# Patient Record
Sex: Female | Born: 1970 | Race: Black or African American | Hispanic: No | Marital: Single | Smoking: Current every day smoker
Health system: Southern US, Community
[De-identification: ages and names within clinical notes are randomized; demographics above are authoritative.]

## PROBLEM LIST (undated history)

## (undated) DIAGNOSIS — I10 Essential (primary) hypertension: Secondary | ICD-10-CM

## (undated) DIAGNOSIS — I2699 Other pulmonary embolism without acute cor pulmonale: Secondary | ICD-10-CM

## (undated) DIAGNOSIS — B009 Herpesviral infection, unspecified: Secondary | ICD-10-CM

## (undated) DIAGNOSIS — E119 Type 2 diabetes mellitus without complications: Secondary | ICD-10-CM

## (undated) HISTORY — DX: Essential (primary) hypertension: I10

## (undated) HISTORY — PX: PACEMAKER IMPLANT: EP1218

---

## 2004-02-10 ENCOUNTER — Emergency Department (HOSPITAL_COMMUNITY): Admission: EM | Admit: 2004-02-10 | Discharge: 2004-02-10 | Payer: Self-pay

## 2009-04-12 ENCOUNTER — Emergency Department (HOSPITAL_COMMUNITY): Admission: EM | Admit: 2009-04-12 | Discharge: 2009-04-12 | Payer: Self-pay | Admitting: Emergency Medicine

## 2009-08-02 ENCOUNTER — Encounter: Admission: RE | Admit: 2009-08-02 | Discharge: 2009-08-02 | Payer: Self-pay | Admitting: Specialist

## 2009-08-26 DIAGNOSIS — B20 Human immunodeficiency virus [HIV] disease: Secondary | ICD-10-CM | POA: Diagnosis present

## 2009-08-26 DIAGNOSIS — Z8659 Personal history of other mental and behavioral disorders: Secondary | ICD-10-CM | POA: Insufficient documentation

## 2009-08-26 DIAGNOSIS — R413 Other amnesia: Secondary | ICD-10-CM | POA: Insufficient documentation

## 2010-06-05 ENCOUNTER — Encounter: Payer: Self-pay | Admitting: Specialist

## 2010-08-17 LAB — URINALYSIS, ROUTINE W REFLEX MICROSCOPIC
Glucose, UA: NEGATIVE mg/dL
Hgb urine dipstick: NEGATIVE
Protein, ur: NEGATIVE mg/dL
pH: 7.5 (ref 5.0–8.0)

## 2010-08-17 LAB — COMPREHENSIVE METABOLIC PANEL
ALT: 11 U/L (ref 0–35)
CO2: 28 mEq/L (ref 19–32)
Chloride: 101 mEq/L (ref 96–112)
Creatinine, Ser: 0.65 mg/dL (ref 0.4–1.2)
GFR calc non Af Amer: >60 mL/min (ref >60)
Total Bilirubin: 0.7 mg/dL (ref 0.3–1.2)

## 2010-08-17 LAB — DIFFERENTIAL
Basophils Relative: 0 % (ref 0–1)
Eosinophils Absolute: 0 10*3/uL (ref 0.0–0.7)
Eosinophils Relative: 0 % (ref 0–5)
Lymphocytes Relative: 15 % (ref 12–46)
Monocytes Absolute: 0.2 10*3/uL (ref 0.1–1.0)
Neutro Abs: 7.5 10*3/uL (ref 1.7–7.7)
Neutrophils Relative %: 82 % — ABNORMAL HIGH (ref 43–77)

## 2010-08-17 LAB — CBC
Hemoglobin: 14.4 g/dL (ref 12.0–15.0)
MCHC: 33.5 g/dL (ref 30.0–36.0)
RBC: 4.89 MIL/uL (ref 3.87–5.11)
RDW: 13.8 % (ref 11.5–15.5)

## 2010-08-17 LAB — D-DIMER, QUANTITATIVE: D-Dimer, Quant: <0.22 ug/mL-FEU (ref 0.00–0.48)

## 2012-10-19 DIAGNOSIS — F339 Major depressive disorder, recurrent, unspecified: Secondary | ICD-10-CM | POA: Diagnosis present

## 2014-12-14 DIAGNOSIS — I10 Essential (primary) hypertension: Secondary | ICD-10-CM | POA: Diagnosis present

## 2015-05-07 DIAGNOSIS — Z86711 Personal history of pulmonary embolism: Secondary | ICD-10-CM | POA: Diagnosis present

## 2015-05-07 DIAGNOSIS — I82531 Chronic embolism and thrombosis of right popliteal vein: Secondary | ICD-10-CM | POA: Diagnosis present

## 2015-05-07 DIAGNOSIS — I2699 Other pulmonary embolism without acute cor pulmonale: Secondary | ICD-10-CM | POA: Diagnosis present

## 2015-06-23 DIAGNOSIS — I421 Obstructive hypertrophic cardiomyopathy: Secondary | ICD-10-CM | POA: Diagnosis present

## 2015-06-23 DIAGNOSIS — Z91148 Patient's other noncompliance with medication regimen for other reason: Secondary | ICD-10-CM | POA: Insufficient documentation

## 2015-06-23 DIAGNOSIS — I4729 Other ventricular tachycardia: Secondary | ICD-10-CM | POA: Insufficient documentation

## 2015-09-24 DIAGNOSIS — Z8669 Personal history of other diseases of the nervous system and sense organs: Secondary | ICD-10-CM | POA: Insufficient documentation

## 2018-07-08 DIAGNOSIS — E1169 Type 2 diabetes mellitus with other specified complication: Secondary | ICD-10-CM | POA: Diagnosis present

## 2018-07-08 DIAGNOSIS — R87622 Low grade squamous intraepithelial lesion on cytologic smear of vagina (LGSIL): Secondary | ICD-10-CM | POA: Insufficient documentation

## 2018-07-08 DIAGNOSIS — E119 Type 2 diabetes mellitus without complications: Secondary | ICD-10-CM

## 2018-07-08 DIAGNOSIS — G4730 Sleep apnea, unspecified: Secondary | ICD-10-CM | POA: Diagnosis present

## 2018-07-10 DIAGNOSIS — R7989 Other specified abnormal findings of blood chemistry: Secondary | ICD-10-CM | POA: Insufficient documentation

## 2018-07-18 DIAGNOSIS — E0590 Thyrotoxicosis, unspecified without thyrotoxic crisis or storm: Secondary | ICD-10-CM | POA: Insufficient documentation

## 2018-10-21 DIAGNOSIS — E041 Nontoxic single thyroid nodule: Secondary | ICD-10-CM | POA: Insufficient documentation

## 2018-10-29 DIAGNOSIS — E042 Nontoxic multinodular goiter: Secondary | ICD-10-CM | POA: Insufficient documentation

## 2018-12-30 ENCOUNTER — Encounter (INDEPENDENT_AMBULATORY_CARE_PROVIDER_SITE_OTHER): Payer: Self-pay | Admitting: Ophthalmology

## 2019-01-06 NOTE — Progress Notes (Addendum)
[facility] Note  01/07/2019     CHIEF COMPLAINT Patient presents for Retina Follow Up   HISTORY OF PRESENT ILLNESS: Lynn Bates is a 48 y.o. female who presents to the clinic today for:   HPI    Retina Follow Up    Patient presents with  Retinal Break/Detachment.  In right eye.  This started weeks ago.  Severity is moderate.  Duration of weeks.  Since onset it is gradually worsening.  I, the attending physician,  performed the HPI with the patient and updated documentation appropriately.          Comments    NP retinal eval--pigmented ret holes OD Patient states her vision is very blurry in both eyes.  She complains of occasional floaters and flashes of light OU.  Patient denies eye pain or discomfort.       Last edited by Lynn Caffey, MD on 01/07/2019 11:12 PM. (History)    pt states she saw Lynn Bates for routine eye exam and was sent here bc he thought he saw some holes in the back of her eye, pt states about 3 weeks ago she was seeing black spots in her right eye, but they are gone now, pt states she has diabetes and HTN, she states she has a pacemaker, pt has an IVC filter, pt states she is HIV+ and her CD4 count is good  Referring physician: Shirleen Schirmer, PA-C [address] [place],  [place] [address]  HISTORICAL INFORMATION:   Selected notes from the [place] Referred by Lynn Schirmer, PA-C for concern of pigmented holes OD LEE:08.05.20 (Lynn Bates) [BCVA: 20/20 OU]  Ocular Hx-glaucoma suspect, DES, NS PMH-DM, HTN, blood clots   CURRENT MEDICATIONS: No current outpatient medications on file. (Ophthalmic Drugs)   No current facility-administered medications for this visit.  (Ophthalmic Drugs)   No current outpatient medications on file. (Other)   No current facility-administered medications for this visit.  (Other)      REVIEW OF SYSTEMS: ROS    Positive for: Eyes   Negative for: Constitutional,  Gastrointestinal, Neurological, Skin, Genitourinary, Musculoskeletal, HENT, Endocrine, Cardiovascular, Respiratory, Psychiatric, Allergic/Imm, Heme/Lymph   Last edited by Lynn Bates on 01/07/2019  1:30 PM. (History)       ALLERGIES Not on File  PAST MEDICAL HISTORY History reviewed. No pertinent past medical history. History reviewed. No pertinent surgical history.  FAMILY HISTORY History reviewed. No pertinent family history.  SOCIAL HISTORY Social History   Tobacco Use  . Smoking status: Not on file  Substance Use Topics  . Alcohol use: Not on file  . Drug use: Not on file         OPHTHALMIC EXAM:  Base Eye Exam    Visual Acuity (Snellen - Linear)      Right Left   Dist [place] 20/25 20/20   Dist ph [place] 20/20        Tonometry (Tonopen, 1:40 PM)      Right Left   Pressure 14 12       Pupils      Dark Light Shape React APD   Right 4 3 Round Brisk 0   Left 4 3 Round Brisk 0       Visual Fields      Left Right    Full Full       Extraocular Movement      Right Left    Full Full  Neuro/Psych    Oriented x3: Yes   Mood/Affect: Normal       Dilation    Both eyes: 1.0% Mydriacyl, 2.5% Phenylephrine @ 1:40 PM        Slit Lamp and Fundus Exam    Slit Lamp Exam      Right Left   Lids/Lashes Normal Normal   Conjunctiva/Sclera mild Melanosis, nasal Pinguecula Melanosis, Pinguecula   Cornea 1+ Punctate epithelial erosions, Arcus 1+ Punctate epithelial erosions, Arcus   Anterior Chamber Deep and quiet Deep and quiet   Iris Round and dilated Round and dilated   Lens 1+ Nuclear sclerosis, 1-2+ Cortical cataract 1+ Nuclear sclerosis, 1-2+ Cortical cataract   Vitreous Vitreous syneresis Vitreous syneresis       Fundus Exam      Right Left   Disc Pink and Sharp Pink and Sharp   C/D Ratio 0.6 0.5   Macula Flat, Good foveal reflex, No heme or edema Flat, Good foveal reflex, No heme or edema   Vessels mild Vascular attenuation Vascular attenuation    Periphery Attached, pigmented CR scar at 0600  Attached, pigment changes inferiorly        Refraction    Wearing Rx      Sphere   Right None   Left None       Manifest Refraction      Sphere Cylinder Axis Dist VA   Right -0.50 +0.50 155 20/20   Left -0.50 +0.50 025 20/20          IMAGING AND PROCEDURES  Imaging and Procedures for _0 @  OCT, Retina - OU - Both Eyes       Right Eye Quality was good. Central Foveal Thickness: 269. Progression has no prior data. Findings include normal foveal contour, no IRF, no SRF, vitreomacular adhesion .   Left Eye Quality was good. Central Foveal Thickness: 270. Progression has no prior data. Findings include normal foveal contour, no IRF, no SRF, vitreomacular adhesion .   Notes *Images captured and stored on drive  Diagnosis / Impression:  NFP, no IRF/SRF/VMA OU  Clinical management:  See below  Abbreviations: NFP - Normal foveal profile. CME - cystoid macular edema. PED - pigment epithelial detachment. IRF - intraretinal fluid. SRF - subretinal fluid. EZ - ellipsoid zone. ERM - epiretinal membrane. ORA - outer retinal atrophy. ORT - outer retinal tubulation. SRHM - subretinal hyper-reflective material                 ASSESSMENT/PLAN:    ICD-10-CM   1. 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 Retinal edema  H35.81 OCT, Retina - OU - Both Eyes  3. Diabetes mellitus type 2 without retinopathy ([place])  E11.9   4. Essential hypertension  I10   5. Hypertensive retinopathy of both eyes  H35.033   6. Combined forms of age-related cataract of both eyes  H25.813   7. 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    - Potential treatment options including observation  and laser retinopexy discussed with patient.  - recommend observation at this time  - f/u in 3-4 wks, sooner prn  2. No retinal edema on exam or OCT  3. Diabetes mellitus,  type 2 without retinopathy  - The incidence, risk factors for progression, natural history and treatment options for diabetic retinopathy  were discussed with patient.    - The need for close monitoring of blood glucose, blood pressure, and serum lipids, avoiding cigarette or any type of tobacco, and the need for long term follow up was also discussed with patient.  - f/u in 1 year, sooner prn   4,5. Hypertensive retinopathy OU  - discussed importance of tight BP control  - monitor  6. Mixed form age related cataracts OU  - mild  - The symptoms of cataract, surgical options, and treatments and risks were discussed with patient.  - discussed diagnosis and progression  - not yet visually significant  - monitor for now  7. 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      Return 3-4 wks, for Dilated Exam, OCT.  There are no Patient Instructions on file for this visit.   Explained the diagnoses, plan, and follow up with the patient and they expressed understanding.  Patient expressed understanding of the importance of proper follow up care.   This document serves as a record of services personally performed by Gardiner Sleeper, MD, PhD. It was created on their behalf by Ernest Mallick, OA, an ophthalmic assistant. The creation of this record is the provider's dictation and/or activities during the visit.    Electronically signed by: Ernest Mallick, OA 08.24.2020 11:27 PM    Gardiner Sleeper, M.D., Ph.D. Diseases & Surgery of the Retina and Vitreous [facility]  I have reviewed the above documentation for accuracy and completeness, and I agree with the above. Gardiner Sleeper,  M.D., Ph.D. 01/07/19 11:27 PM    Abbreviations: M myopia (nearsighted); A astigmatism; H hyperopia (farsighted); P presbyopia; Mrx spectacle prescription;  CTL contact lenses; OD right eye; OS left eye; OU both eyes  XT exotropia; ET esotropia; PEK punctate epithelial keratitis; PEE punctate epithelial erosions; DES dry eye syndrome; MGD meibomian gland dysfunction; ATs artificial tears; PFAT's preservative free artificial tears; [place] nuclear sclerotic cataract; PSC posterior subcapsular cataract; ERM epi-retinal membrane; PVD posterior vitreous detachment; RD retinal detachment; DM diabetes mellitus; DR diabetic retinopathy; NPDR non-proliferative diabetic retinopathy; PDR proliferative diabetic retinopathy; CSME clinically significant macular edema; DME diabetic macular edema; dbh dot blot hemorrhages; CWS cotton wool spot; POAG primary open angle glaucoma; C/D cup-to-disc ratio; HVF humphrey visual field; GVF goldmann visual field; OCT optical coherence tomography; IOP intraocular pressure; BRVO Branch retinal vein occlusion; CRVO central retinal vein occlusion; CRAO central retinal artery occlusion; BRAO branch retinal artery occlusion; RT retinal tear; SB scleral buckle; PPV pars plana vitrectomy; VH Vitreous hemorrhage; PRP panretinal laser photocoagulation; IVK intravitreal kenalog; VMT vitreomacular traction; MH Macular hole;  NVD neovascularization of the disc; NVE neovascularization elsewhere; AREDS age related eye disease study; ARMD age related macular degeneration; POAG primary open angle glaucoma; EBMD epithelial/anterior basement membrane dystrophy; ACIOL anterior chamber intraocular lens; IOL intraocular lens; PCIOL posterior chamber intraocular lens; Phaco/IOL phacoemulsification with intraocular lens placement; [place] photorefractive keratectomy; LASIK laser assisted in situ keratomileusis; HTN hypertension; DM diabetes mellitus; COPD chronic obstructive pulmonary disease

## 2019-01-07 ENCOUNTER — Ambulatory Visit (INDEPENDENT_AMBULATORY_CARE_PROVIDER_SITE_OTHER): Payer: Medicaid Other | Admitting: Ophthalmology

## 2019-01-07 ENCOUNTER — Other Ambulatory Visit: Payer: Self-pay

## 2019-01-07 ENCOUNTER — Encounter (INDEPENDENT_AMBULATORY_CARE_PROVIDER_SITE_OTHER): Payer: Self-pay | Admitting: Ophthalmology

## 2019-01-07 DIAGNOSIS — H33301 Unspecified retinal break, right eye: Secondary | ICD-10-CM | POA: Diagnosis not present

## 2019-01-07 DIAGNOSIS — H3581 Retinal edema: Secondary | ICD-10-CM | POA: Diagnosis not present

## 2019-01-07 DIAGNOSIS — B20 Human immunodeficiency virus [HIV] disease: Secondary | ICD-10-CM

## 2019-01-07 DIAGNOSIS — I10 Essential (primary) hypertension: Secondary | ICD-10-CM

## 2019-01-07 DIAGNOSIS — E119 Type 2 diabetes mellitus without complications: Secondary | ICD-10-CM | POA: Diagnosis not present

## 2019-01-07 DIAGNOSIS — H25813 Combined forms of age-related cataract, bilateral: Secondary | ICD-10-CM

## 2019-01-07 DIAGNOSIS — H35033 Hypertensive retinopathy, bilateral: Secondary | ICD-10-CM

## 2019-01-28 ENCOUNTER — Encounter (INDEPENDENT_AMBULATORY_CARE_PROVIDER_SITE_OTHER): Payer: Medicaid Other | Admitting: Ophthalmology

## 2019-01-30 NOTE — Progress Notes (Addendum)
[facility] - Clinic Note  01/31/2019     CHIEF COMPLAINT Patient presents for Retina Follow Up   HISTORY OF PRESENT ILLNESS: Lynn Bates is a 48 y.o. female who presents to the clinic today for:   HPI    Retina Follow Up    Patient presents with  Other.  In both eyes.  This started weeks ago.  Severity is moderate.  Duration of weeks.  Since onset it is stable.  I, the attending physician,  performed the HPI with the patient and updated documentation appropriately.          Comments    Patient states her vision is about the same.  She denies eye pain or discomfort and denies any new or worsening floaters or fol OU.       Last edited by [name], MD on 01/31/2019 10:30 AM. (History)    pt states   Referring physician: [name], PA-C [address] [place],  [place] [address]  HISTORICAL INFORMATION:   Selected notes from the [facility] Referred by [name], PA-C for concern of pigmented holes OD LEE:08.05.20 (A. Lundquist) [BCVA: 20/20 OU]  Ocular Hx-glaucoma suspect, DES, NS PMH-DM, HTN, blood clots   CURRENT MEDICATIONS: Current Outpatient Medications (Ophthalmic Drugs)  Medication Sig  . prednisoLONE acetate (PRED FORTE) 1 % ophthalmic suspension Place 1 drop into the right eye 4 (four) times daily for 7 days.   No current facility-administered medications for this visit.  (Ophthalmic Drugs)   No current outpatient medications on file. (Other)   No current facility-administered medications for this visit.  (Other)      REVIEW OF SYSTEMS: ROS    Positive for: Eyes   Negative for: Constitutional, Gastrointestinal, Neurological, Skin, Genitourinary, Musculoskeletal, HENT, Endocrine, Cardiovascular, Respiratory, Psychiatric, Allergic/Imm, Heme/Lymph   Last edited by [name] on 01/31/2019  9:47 AM. (History)       ALLERGIES Not on File  PAST MEDICAL HISTORY History reviewed. No pertinent past  medical history. History reviewed. No pertinent surgical history.  FAMILY HISTORY History reviewed. No pertinent family history.  SOCIAL HISTORY Social History   Tobacco Use  . Smoking status: Not on file  Substance Use Topics  . Alcohol use: Not on file  . Drug use: Not on file         OPHTHALMIC EXAM:  Base Eye Exam    Visual Acuity (Snellen - Linear)      Right Left   Dist [place] 20/20 20/20       Tonometry (Tonopen, 9:51 AM)      Right Left   Pressure 22 23       Tonometry #2 (Tonopen, 10:42 AM)      Right Left   Pressure 19 17       Pupils      Dark Light Shape React APD   Right 3 2 Round Brisk 0   Left 3 2 Round Brisk 0       Visual Fields      Left Right    Full Full       Extraocular Movement      Right Left    Full Full       Neuro/Psych    Oriented x3: Yes   Mood/Affect: Normal       Dilation    Both eyes: 1.0% Mydriacyl, 2.5% Phenylephrine @ 9:52 AM        Slit Lamp and Fundus  Exam    Slit Lamp Exam      Right Left   Lids/Lashes Normal Normal   Conjunctiva/Sclera mild Melanosis, nasal Pinguecula Melanosis, Pinguecula   Cornea 1+ Punctate epithelial erosions, Arcus 1+ Punctate epithelial erosions, Arcus   Anterior Chamber Deep and quiet Deep and quiet   Iris Round and dilated Round and dilated   Lens 1+ Nuclear sclerosis, 1-2+ Cortical cataract 1+ Nuclear sclerosis, 1-2+ Cortical cataract   Vitreous Vitreous syneresis Vitreous syneresis       Fundus Exam      Right Left   Disc Pink and Sharp Pink and Sharp   C/D Ratio 0.6 0.5   Macula Flat, Good foveal reflex, No heme or edema Flat, Good foveal reflex, No heme or edema   Vessels mild Vascular attenuation Vascular attenuation   Periphery Attached, pigmented retinal defect/CR scar at 0600  Attached, pigment changes inferiorly        Refraction    Wearing Rx      Sphere   Right None   Left None          IMAGING AND PROCEDURES  Imaging and Procedures for @TODAY @  OCT,  Retina - OU - Both Eyes       Right Eye Quality was good. Central Foveal Thickness: 268. Progression has been stable. Findings include normal foveal contour, no IRF, no SRF, vitreomacular adhesion .   Left Eye Quality was good. Central Foveal Thickness: 268. Progression has been stable. Findings include normal foveal contour, no IRF, no SRF, vitreomacular adhesion .   Notes *Images captured and stored on drive  Diagnosis / Impression:  NFP, no IRF/SRF; +VMA OU  Clinical management:  See below  Abbreviations: NFP - Normal foveal profile. CME - cystoid macular edema. PED - pigment epithelial detachment. IRF - intraretinal fluid. SRF - subretinal fluid. EZ - ellipsoid zone. ERM - epiretinal membrane. ORA - outer retinal atrophy. ORT - outer retinal tubulation. SRHM - subretinal hyper-reflective material        Repair Retinal Breaks, Laser - OD - Right Eye       LASER PROCEDURE NOTE  Procedure:  Barrier laser retinopexy using slit lamp laser, RIGHT eye   Diagnosis:   Retinal defect, RIGHT eye                     6 o'clock equator   Surgeon: [name], MD, PhD  Anesthesia: Topical  Informed consent obtained, operative eye marked, and time out performed prior to initiation of laser.   Laser settings:  Lumenis [id number] laser, slit lamp Lens: [place] PRP 165 Power: 250 mW Spot size: 200 microns Duration: 30 msec  # spots: 175  Placement of laser: Using a [place] PRP 165 contact lens at the slit lamp, laser was placed in three confluent rows around retinal defect at 6 oclock equator with additional rows anteriorly.  Complications: None.  Patient tolerated the procedure well and received written and verbal post-procedure care information/education.                  ASSESSMENT/PLAN:    ICD-10-CM   1. Right retinal defect  H33.301 Repair Retinal Breaks, Laser - OD - Right Eye  2. Retinal edema  H35.81 OCT, Retina - OU - Both Eyes  3. Diabetes mellitus  type 2 without retinopathy ([place])  E11.9   4. Essential hypertension  I10   5. Hypertensive retinopathy of both eyes  H35.033   6. Combined forms of age-related  cataract of both eyes  H25.813   7. HIV disease ([facility])  B20     1. Retinal defect OD  - focal pigmented retinal defect/CR scar located at 0600, equator  - asymptomatic  - no surrounding SRF  - The incidence, risk factors, and natural history of retinal defect was discussed with patient.    - Potential treatment options including observation and laser retinopexy discussed with patient.  - recommend laser retinopexy OD today, 09.18.20  - pt wishes to proceed  - RBA of procedure discussed, questions answered  - informed consent obtained and signed  - see procedure note  - start PF QID OD x7d  - f/u in 2-3 wks  2. No retinal edema on exam or OCT  3. Diabetes mellitus, type 2 without retinopathy  - The incidence, risk factors for progression, natural history and treatment options for diabetic retinopathy  were discussed with patient.    - The need for close monitoring of blood glucose, blood pressure, and serum lipids, avoiding cigarette or any type of tobacco, and the need for long term follow up was also discussed with patient.  - f/u in 1 year, sooner prn   4,5. Hypertensive retinopathy OU  - discussed importance of tight BP control  - monitor  6. Mixed form age related cataracts OU  - mild  - The symptoms of cataract, surgical options, and treatments and risks were discussed with patient.  - discussed diagnosis and progression  - not yet visually significant  - monitor for now  7. 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 prednisoLONE acetate (PRED FORTE) 1 % ophthalmic suspension    Sig: Place 1 drop into the right eye 4 (four) times daily for 7 days.    Dispense:  10 mL    Refill:  0       Return in about 2  weeks (around 02/14/2019) for f/u retinal defect OD, DFE, OCT.  There are no Patient Instructions on file for this visit.   Explained the diagnoses, plan, and follow up with the patient and they expressed understanding.  Patient expressed understanding of the importance of proper follow up care.   This document serves as a record of services personally performed by Karie ChimeraBrian G. Jamariyah Johannsen, MD, PhD. It was created on their behalf by Laurian BrimAmanda Brown, OA, an ophthalmic assistant. The creation of this record is the provider's dictation and/or activities during the visit.    Electronically signed by: Laurian BrimAmanda Brown, OA  09.17.2020 12:14 PM    Karie ChimeraBrian G. Chin Wachter, M.D., Ph.D. Diseases & Surgery of the Retina and Vitreous [facility]  I have reviewed the above documentation for accuracy and completeness, and I agree with the above. Karie ChimeraBrian G. Amilyah Nack, M.D., Ph.D. 01/31/19 12:14 PM   Abbreviations: M myopia (nearsighted); A astigmatism; H hyperopia (farsighted); P presbyopia; Mrx spectacle prescription;  CTL contact lenses; OD right eye; OS left eye; OU both eyes  XT exotropia; ET esotropia; PEK punctate epithelial keratitis; PEE punctate epithelial erosions; DES dry eye syndrome; MGD meibomian gland dysfunction; ATs artificial tears; PFAT's preservative free artificial tears; NSC nuclear sclerotic cataract; PSC posterior subcapsular cataract; ERM epi-retinal membrane; PVD posterior vitreous detachment; RD retinal detachment; DM diabetes mellitus; DR diabetic retinopathy; NPDR non-proliferative diabetic retinopathy; PDR proliferative diabetic retinopathy; CSME clinically significant macular edema; DME diabetic macular edema; dbh dot blot hemorrhages; CWS cotton  wool spot; POAG primary open angle glaucoma; C/D cup-to-disc ratio; HVF humphrey visual field; GVF goldmann visual field; OCT optical coherence tomography; IOP intraocular pressure; BRVO Branch retinal vein occlusion; CRVO central retinal vein  occlusion; CRAO central retinal artery occlusion; BRAO branch retinal artery occlusion; RT retinal tear; SB scleral buckle; PPV pars plana vitrectomy; VH Vitreous hemorrhage; PRP panretinal laser photocoagulation; IVK intravitreal kenalog; VMT vitreomacular traction; MH Macular hole;  NVD neovascularization of the disc; NVE neovascularization elsewhere; AREDS age related eye disease study; ARMD age related macular degeneration; POAG primary open angle glaucoma; EBMD epithelial/anterior basement membrane dystrophy; ACIOL anterior chamber intraocular lens; IOL intraocular lens; PCIOL posterior chamber intraocular lens; Phaco/IOL phacoemulsification with intraocular lens placement; PRK photorefractive keratectomy; LASIK laser assisted in situ keratomileusis; HTN hypertension; DM diabetes mellitus; COPD chronic obstructive pulmonary disease

## 2019-01-31 ENCOUNTER — Ambulatory Visit (INDEPENDENT_AMBULATORY_CARE_PROVIDER_SITE_OTHER): Payer: Medicaid Other | Admitting: Ophthalmology

## 2019-01-31 ENCOUNTER — Other Ambulatory Visit: Payer: Self-pay

## 2019-01-31 ENCOUNTER — Encounter (INDEPENDENT_AMBULATORY_CARE_PROVIDER_SITE_OTHER): Payer: Self-pay | Admitting: Ophthalmology

## 2019-01-31 DIAGNOSIS — H3581 Retinal edema: Secondary | ICD-10-CM | POA: Diagnosis not present

## 2019-01-31 DIAGNOSIS — I10 Essential (primary) hypertension: Secondary | ICD-10-CM | POA: Diagnosis not present

## 2019-01-31 DIAGNOSIS — H33301 Unspecified retinal break, right eye: Secondary | ICD-10-CM | POA: Diagnosis not present

## 2019-01-31 DIAGNOSIS — H25813 Combined forms of age-related cataract, bilateral: Secondary | ICD-10-CM

## 2019-01-31 DIAGNOSIS — E119 Type 2 diabetes mellitus without complications: Secondary | ICD-10-CM

## 2019-01-31 DIAGNOSIS — H35033 Hypertensive retinopathy, bilateral: Secondary | ICD-10-CM

## 2019-01-31 DIAGNOSIS — B20 Human immunodeficiency virus [HIV] disease: Secondary | ICD-10-CM

## 2019-01-31 MED ORDER — PREDNISOLONE ACETATE 1 % OP SUSP: 1.0000 [drp] | Freq: Four times a day (QID) | OPHTHALMIC | 0 refills | Status: AC

## 2019-02-12 ENCOUNTER — Encounter (INDEPENDENT_AMBULATORY_CARE_PROVIDER_SITE_OTHER): Payer: Medicaid Other | Admitting: Ophthalmology

## 2019-02-17 DIAGNOSIS — K219 Gastro-esophageal reflux disease without esophagitis: Secondary | ICD-10-CM | POA: Diagnosis present

## 2019-02-17 NOTE — Progress Notes (Signed)
[facility] - Clinic Note  02/25/2019     CHIEF COMPLAINT Patient presents for Retina Follow Up   HISTORY OF PRESENT ILLNESS: Lynn Bates is a 48 y.o. female who presents to the clinic today for:   HPI    Retina Follow Up    Patient presents with  Other.  In right eye.  This started weeks ago.  Severity is moderate.  Duration of weeks.  Since onset it is stable.  I, the attending physician,  performed the HPI with the patient and updated documentation appropriately.          Comments    Pt states her vision is still very good.  Patient complains of pain above brow OD.  Patient denies any new or worsening floaters or fol OU.       Last edited by Rennis ChrisZamora, Spenser Harren, MD on 02/25/2019  1:01 PM. (History)    pt states she is delayed to follow up due to having another appt, she states her right eye brow has been hurting for a couple days  Referring physician: Alma DownsAlbert Lundquist, PA-C [address] [place],  [place] [id number]  HISTORICAL INFORMATION:   Selected notes from the MEDICAL RECORD NUMBER Referred by Alma DownsAlbert Lundquist, PA-C for concern of pigmented holes OD LEE:08.05.20 (A. Lundquist) [BCVA: 20/20 OU]  Ocular Hx-glaucoma suspect, DES, NS PMH-DM, HTN, blood clots   CURRENT MEDICATIONS: No current outpatient medications on file. (Ophthalmic Drugs)   No current facility-administered medications for this visit.  (Ophthalmic Drugs)   No current outpatient medications on file. (Other)   No current facility-administered medications for this visit.  (Other)      REVIEW OF SYSTEMS: ROS    Positive for: Eyes   Negative for: Constitutional, Gastrointestinal, Neurological, Skin, Genitourinary, Musculoskeletal, HENT, Endocrine, Cardiovascular, Respiratory, Psychiatric, Allergic/Imm, Heme/Lymph   Last edited by Corrinne EagleEnglish, Ashley L on 02/25/2019 10:17 AM. (History)       ALLERGIES Not on File  PAST MEDICAL HISTORY History reviewed. No pertinent past  medical history. History reviewed. No pertinent surgical history.  FAMILY HISTORY History reviewed. No pertinent family history.  SOCIAL HISTORY Social History   Tobacco Use  . Smoking status: Not on file  Substance Use Topics  . Alcohol use: Not on file  . Drug use: Not on file         OPHTHALMIC EXAM:  Base Eye Exam    Visual Acuity (Snellen - Linear)      Right Left   Dist [place] 20/20 -1 20/20       Tonometry (Tonopen, 10:20 AM)      Right Left   Pressure 23 19       Pupils      Dark Light Shape React APD   Right 4 3 Round Brisk 0   Left 4 3 Round Brisk 0       Visual Fields      Left Right    Full Full       Extraocular Movement      Right Left    Full Full       Neuro/Psych    Oriented x3: Yes   Mood/Affect: Normal       Dilation    Both eyes: 1.0% Mydriacyl, 2.5% Phenylephrine @ 10:20 AM        Slit Lamp and Fundus Exam    Slit Lamp Exam      Right Left   Lids/Lashes Normal Normal  Conjunctiva/Sclera mild Melanosis, nasal Pinguecula, Trace Injection Melanosis, nasal and temporal Pinguecula, Trace Injection   Cornea Trace Punctate epithelial erosions, Arcus 1+ Punctate epithelial erosions, Arcus   Anterior Chamber Deep and quiet Deep and quiet   Iris Round and dilated Round and dilated   Lens 1+ Nuclear sclerosis, 1-2+ Cortical cataract 1+ Nuclear sclerosis, 1-2+ Cortical cataract   Vitreous Vitreous syneresis Vitreous syneresis       Fundus Exam      Right Left   Disc Pink and Sharp Pink and Sharp   C/D Ratio 0.6 0.5   Macula Flat, Good foveal reflex, No heme or edema Flat, Good foveal reflex, No heme or edema   Vessels mild Vascular attenuation Vascular attenuation   Periphery Attached, pigmented retinal defects/CR scar at 0600 equator -- good laser changes surrounding Attached, pigment changes inferiorly        Refraction    Wearing Rx      Sphere   Right None   Left None          IMAGING AND PROCEDURES  Imaging and  Procedures for @TODAY @  OCT, Retina - OU - Both Eyes       Right Eye Quality was good. Central Foveal Thickness: 270. Progression has been stable. Findings include normal foveal contour, no IRF, no SRF, vitreomacular adhesion .   Left Eye Quality was good. Central Foveal Thickness: 269. Progression has been stable. Findings include normal foveal contour, no IRF, no SRF, vitreomacular adhesion .   Notes *Images captured and stored on drive  Diagnosis / Impression:  NFP, no IRF/SRF; +VMA OU  Clinical management:  See below  Abbreviations: NFP - Normal foveal profile. CME - cystoid macular edema. PED - pigment epithelial detachment. IRF - intraretinal fluid. SRF - subretinal fluid. EZ - ellipsoid zone. ERM - epiretinal membrane. ORA - outer retinal atrophy. ORT - outer retinal tubulation. SRHM - subretinal hyper-reflective material                 ASSESSMENT/PLAN:    ICD-10-CM   1. 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 Retinal edema  H35.81 OCT, Retina - OU - Both Eyes  3. Diabetes mellitus type 2 without retinopathy (HCC)  E11.9   4. Essential hypertension  I10   5. Hypertensive retinopathy of both eyes  H35.033   6. Combined forms of age-related cataract of both eyes  H25.813   7. HIV disease (HCC)  B20     1. Retinal defect OD  - focal pigmented retinal defects/CR scars located at 0600, equator  - asymptomatic  - no surrounding SRF   - s/p laser retinopexy OD (09.18.20) -- good laser changes surrounding lesions  - f/u in 3 months  2. No retinal edema on exam or OCT  3. Diabetes mellitus, type 2 without retinopathy  - The incidence, risk factors for progression, natural history and treatment options for diabetic retinopathy  were discussed with patient.    - The need for close monitoring of blood glucose, blood pressure, and serum lipids, avoiding cigarette or any type of tobacco, and the need for long term follow up was also discussed with patient.  - f/u in 1  year, sooner prn   4,5. Hypertensive retinopathy OU  - discussed importance of tight BP control  - monitor  6. Mixed form age related cataracts OU  - mild  - The symptoms of cataract, surgical options, and treatments and risks were discussed with patient.  - discussed  diagnosis and progression  - not yet visually significant  - monitor for now  7. 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      Return in about 3 months (around 05/28/2019) for f/u retinal defect OD, DFE, OCT.  There are no Patient Instructions on file for this visit.   Explained the diagnoses, plan, and follow up with the patient and they expressed understanding.  Patient expressed understanding of the importance of proper follow up care.   Electronically signed by: Estill Bakes, COT 02/17/19 10:21 a.m.  Gardiner Sleeper, M.D., Ph.D. Diseases & Surgery of the Retina and Vitreous [facility] 02/25/19  I have reviewed the above documentation for accuracy and completeness, and I agree with the above. Gardiner Sleeper, M.D., Ph.D. 02/25/19 1:03 PM     Abbreviations: M myopia (nearsighted); A astigmatism; H hyperopia (farsighted); P presbyopia; Mrx spectacle prescription;  CTL contact lenses; OD right eye; OS left eye; OU both eyes  XT exotropia; ET esotropia; PEK punctate epithelial keratitis; PEE punctate epithelial erosions; DES dry eye syndrome; MGD meibomian gland dysfunction; ATs artificial tears; PFAT's preservative free artificial tears; [place] nuclear sclerotic cataract; PSC posterior subcapsular cataract; ERM epi-retinal membrane; PVD posterior vitreous detachment; RD retinal detachment; DM diabetes mellitus; DR diabetic retinopathy; NPDR non-proliferative diabetic retinopathy; PDR proliferative diabetic retinopathy; CSME clinically significant macular edema; DME diabetic  macular edema; dbh dot blot hemorrhages; CWS cotton wool spot; POAG primary open angle glaucoma; C/D cup-to-disc ratio; HVF humphrey visual field; GVF goldmann visual field; OCT optical coherence tomography; IOP intraocular pressure; BRVO Branch retinal vein occlusion; CRVO central retinal vein occlusion; CRAO central retinal artery occlusion; BRAO branch retinal artery occlusion; RT retinal tear; SB scleral buckle; PPV pars plana vitrectomy; VH Vitreous hemorrhage; PRP panretinal laser photocoagulation; IVK intravitreal kenalog; VMT vitreomacular traction; MH Macular hole;  NVD neovascularization of the disc; NVE neovascularization elsewhere; AREDS age related eye disease study; ARMD age related macular degeneration; POAG primary open angle glaucoma; EBMD epithelial/anterior basement membrane dystrophy; ACIOL anterior chamber intraocular lens; IOL intraocular lens; PCIOL posterior chamber intraocular lens; Phaco/IOL phacoemulsification with intraocular lens placement; [place] photorefractive keratectomy; LASIK laser assisted in situ keratomileusis; HTN hypertension; DM diabetes mellitus; COPD chronic obstructive pulmonary disease

## 2019-02-25 ENCOUNTER — Ambulatory Visit (INDEPENDENT_AMBULATORY_CARE_PROVIDER_SITE_OTHER): Payer: Medicaid Other | Admitting: Ophthalmology

## 2019-02-25 ENCOUNTER — Encounter (INDEPENDENT_AMBULATORY_CARE_PROVIDER_SITE_OTHER): Payer: Self-pay | Admitting: Ophthalmology

## 2019-02-25 DIAGNOSIS — H33301 Unspecified retinal break, right eye: Secondary | ICD-10-CM

## 2019-02-25 DIAGNOSIS — I10 Essential (primary) hypertension: Secondary | ICD-10-CM

## 2019-02-25 DIAGNOSIS — B20 Human immunodeficiency virus [HIV] disease: Secondary | ICD-10-CM

## 2019-02-25 DIAGNOSIS — H3581 Retinal edema: Secondary | ICD-10-CM

## 2019-02-25 DIAGNOSIS — H35033 Hypertensive retinopathy, bilateral: Secondary | ICD-10-CM

## 2019-02-25 DIAGNOSIS — E119 Type 2 diabetes mellitus without complications: Secondary | ICD-10-CM

## 2019-02-25 DIAGNOSIS — H25813 Combined forms of age-related cataract, bilateral: Secondary | ICD-10-CM

## 2019-05-28 ENCOUNTER — Encounter (INDEPENDENT_AMBULATORY_CARE_PROVIDER_SITE_OTHER): Payer: Medicaid Other | Admitting: Ophthalmology

## 2019-06-23 ENCOUNTER — Encounter (INDEPENDENT_AMBULATORY_CARE_PROVIDER_SITE_OTHER): Payer: Medicaid Other | Admitting: Ophthalmology

## 2019-07-31 NOTE — Progress Notes (Signed)
[facility] - Clinic Note  08/06/2019     CHIEF COMPLAINT Patient presents for Retina Follow Up   HISTORY OF PRESENT ILLNESS: Lynn Bates is a 49 y.o. female who presents to the clinic today for:   HPI    Retina Follow Up    Patient presents with  Other (Retinal defect).  In right eye.  Severity is moderate.  Duration of 4 months.  Since onset it is gradually worsening.  I, the attending physician,  performed the HPI with the patient and updated documentation appropriately.          Comments    Patient states vision may be slightly worse OD. On latanoprost qhs OU, but hasn't used gtt in a couple of days. Doesn't check BS at home. On prednisone (?for breathing). Last a1c unknown.        Last edited by [name], MD on 08/06/2019 11:25 AM. (History)    pt states she is still using latanoprost at night, but has not used it in a couple days  Referring physician: [name], PA-C [address] ,  [place] [address]  HISTORICAL INFORMATION:   Selected notes from the MEDICAL RECORD NUMBER Referred by [name], PA-C for concern of pigmented holes OD LEE:08.05.20 (A. Lundquist) [BCVA: 20/20 OU]  Ocular Hx-glaucoma suspect, DES, NS PMH-DM, HTN, blood clots   CURRENT MEDICATIONS: Current Outpatient Medications (Ophthalmic Drugs)  Medication Sig  . latanoprost (XALATAN) 0.005 % ophthalmic solution Place 1 drop into both eyes at bedtime.   No current facility-administered medications for this visit. (Ophthalmic Drugs)   Current Outpatient Medications (Other)  Medication Sig  . albuterol (PROAIR HFA) 108 (90 Base) MCG/ACT inhaler Inhale 1 puff into the lungs as directed.  Marland Kitchen amLODipine (NORVASC) 10 MG tablet Take 10 mg by mouth at bedtime.  Marland Kitchen apixaban (ELIQUIS) 5 MG TABS tablet Take 5 mg by mouth in the morning and at bedtime.  . budesonide-formoterol (SYMBICORT) 160-4.5 MCG/ACT inhaler Inhale 1 puff into the lungs in the morning and at  bedtime.  Marland Kitchen emtricitabine-tenofovir (TRUVADA) 200-300 MG tablet Take by mouth as directed.  . famotidine (PEPCID) 20 MG tablet Take 20 mg by mouth in the morning and at bedtime.  . furosemide (LASIX) 20 MG tablet Take 20 mg by mouth in the morning and at bedtime.  Marland Kitchen linaclotide (LINZESS) 145 MCG CAPS capsule Take 145 mcg by mouth daily.  . methocarbamol (ROBAXIN) 750 MG tablet Take 750 mg by mouth in the morning, at noon, and at bedtime.  . metoprolol tartrate (LOPRESSOR) 50 MG tablet Take 50 mg by mouth in the morning and at bedtime.  . montelukast (SINGULAIR) 10 MG tablet Take 10 mg by mouth as directed.  . pantoprazole (PROTONIX) 40 MG tablet Take 40 mg by mouth daily.  . pravastatin (PRAVACHOL) 20 MG tablet Take 20 mg by mouth daily.  . predniSONE (DELTASONE) 10 MG tablet Take 10 mg by mouth as directed.  . raltegravir (ISENTRESS) 400 MG tablet Take 400 mg by mouth in the morning and at bedtime.  . ranitidine (ZANTAC) 300 MG tablet Take 300 mg by mouth as directed.  . risperiDONE (RISPERDAL) 2 MG tablet Take 2 mg by mouth daily.  . rosuvastatin (CRESTOR) 10 MG tablet Take 10 mg by mouth at bedtime.  . Semaglutide,0.25 or 0.5MG /DOS, (OZEMPIC, 0.25 OR 0.5 MG/DOSE,) 2 MG/1.5ML SOPN Inject 0.25 mg into the skin as directed.  . Tiotropium Bromide Monohydrate 2.5 MCG/ACT AERS  Inhale 2 puffs into the lungs daily.  Marland Kitchen tiZANidine (ZANAFLEX) 2 MG tablet Take 2 mg by mouth as directed.  . topiramate (TOPAMAX) 25 MG capsule Take 25 mg by mouth in the morning and at bedtime.  . traZODone (DESYREL) 100 MG tablet Take 100 mg by mouth at bedtime.  . triamcinolone cream (KENALOG) 0.1 % Apply 1 application topically in the morning and at bedtime.   No current facility-administered medications for this visit. (Other)      REVIEW OF SYSTEMS: ROS    Positive for: Eyes   Negative for: Constitutional, Gastrointestinal, Neurological, Skin, Genitourinary, Musculoskeletal, HENT, Endocrine, Cardiovascular,  Respiratory, Psychiatric, Allergic/Imm, Heme/Lymph   Last edited by Roselee Nova D, COT on 08/06/2019 10:03 AM. (History)       ALLERGIES No Known Allergies  PAST MEDICAL HISTORY History reviewed. No pertinent past medical history. History reviewed. No pertinent surgical history.  FAMILY HISTORY Family History  Problem Relation Age of Onset  . Glaucoma Father   . Glaucoma Sister   . Diabetes Other   . Diabetes Maternal Aunt     SOCIAL HISTORY Social History   Tobacco Use  . Smoking status: Current Every Day Smoker  . Smokeless tobacco: Current User  Substance Use Topics  . Alcohol use: Not on file  . Drug use: Never         OPHTHALMIC EXAM:  Base Eye Exam    Visual Acuity (Snellen - Linear)      Right Left   Dist [place] 20/20 -1 20/20       Tonometry (Tonopen, 10:19 AM)      Right Left   Pressure 26 25  Hasn't used latanoprost in 2 days       Pupils      Dark Light Shape React APD   Right 4 3 Round Brisk None   Left 4 3 Round Brisk None       Visual Fields (Counting fingers)      Left Right    Full Full       Extraocular Movement      Right Left    Full, Ortho Full, Ortho       Neuro/Psych    Oriented x3: Yes   Mood/Affect: Normal       Dilation    Both eyes: 1.0% Mydriacyl, 2.5% Phenylephrine @ 10:19 AM        Slit Lamp and Fundus Exam    Slit Lamp Exam      Right Left   Lids/Lashes Dermatochalasis - upper lid Dermatochalasis - upper lid   Conjunctiva/Sclera mild Melanosis, nasal Pinguecula, Trace Injection Melanosis, nasal and temporal Pinguecula, Trace Injection   Cornea Trace Punctate epithelial erosions, Arcus 1+ Punctate epithelial erosions, Arcus   Anterior Chamber Deep and quiet Deep and quiet   Iris Round and dilated Round and dilated   Lens 1+ Nuclear sclerosis, 1-2+ Cortical cataract 1+ Nuclear sclerosis, 1-2+ Cortical cataract   Vitreous Vitreous syneresis Vitreous syneresis       Fundus Exam      Right Left   Disc  Pink and Sharp Pink and Sharp   C/D Ratio 0.6 0.5   Macula Flat, Good foveal reflex, No heme or edema Flat, Good foveal reflex, No heme or edema   Vessels mild Vascular attenuation, Tortuousity mild Vascular attenuation, Tortuousity   Periphery Attached, pigmented retinal defects/CR scar at 0600 equator -- good laser changes surrounding, No heme, no new RT/RD Attached, pigment changes inferiorly  Refraction    Manifest Refraction      Sphere Cylinder Axis Dist [place]   Right -0.25 +1.00 155 20/20   Left [place] +0.50 030 20/20          IMAGING AND PROCEDURES  Imaging and Procedures for @TODAY @  OCT, Retina - OU - Both Eyes       Right Eye Quality was good. Central Foveal Thickness: 274. Progression has been stable. Findings include normal foveal contour, no IRF, no SRF, vitreomacular adhesion .   Left Eye Quality was good. Central Foveal Thickness: 269. Progression has been stable. Findings include normal foveal contour, no IRF, no SRF, vitreomacular adhesion .   Notes *Images captured and stored on drive  Diagnosis / Impression:  NFP, no IRF/SRF; +VMA OU  Clinical management:  See below  Abbreviations: NFP - Normal foveal profile. CME - cystoid macular edema. PED - pigment epithelial detachment. IRF - intraretinal fluid. SRF - subretinal fluid. EZ - ellipsoid zone. ERM - epiretinal membrane. ORA - outer retinal atrophy. ORT - outer retinal tubulation. SRHM - subretinal hyper-reflective material                 ASSESSMENT/PLAN:    ICD-10-CM   1. 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 Retinal edema  H35.81 OCT, Retina - OU - Both Eyes  3. Diabetes mellitus type 2 without retinopathy (HCC)  E11.9   4. Essential hypertension  I10   5. Hypertensive retinopathy of both eyes  H35.033   6. Combined forms of age-related cataract of both eyes  H25.813   7. HIV disease (HCC)  B20   8. 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 Retinal defect OD  - focal pigmented  retinal defects/CR scars located at 0600, equator  - asymptomatic  - no surrounding SRF   - s/p laser retinopexy OD (09.18.20) -- good laser changes surrounding lesions  - f/u 1 year, sooner prn  2. No retinal edema on exam or OCT  3. Diabetes mellitus, type 2 without retinopathy  - The incidence, risk factors for progression, natural history and treatment options for diabetic retinopathy  were discussed with patient.    - The need for close monitoring of blood glucose, blood pressure, and serum lipids, avoiding cigarette or any type of tobacco, and the need for long term follow up was also discussed with patient.  - f/u in 1 year, sooner prn   4,5. Hypertensive retinopathy OU  - discussed importance of tight BP control  - monitor  6. Mixed form age related cataracts OU  - mild  - The symptoms of cataract, surgical options, and treatments and risks were discussed with patient.  - discussed diagnosis and progression  - not yet visually significant  - monitor for now  7. 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 Ocular hypertension OU  - IOP 26 OD and 25 OS  - pt reports she has not used Latanoprost for several days  - discussed importance of compliance with drops for vision and ocular health  - continue latanaprost qhs OU   Ophthalmic Meds Ordered this visit:  No orders of the defined types were placed in this encounter.      Return in about 1 year (around 08/05/2020) for f/u DM exam, DFE, OCT.  There are no Patient Instructions on file for this visit.  This document serves as a record of services personally performed by 08/07/2020, MD, PhD.  It was created on their behalf by Laurian Brim, OA, an ophthalmic assistant. The creation of this record is the provider's dictation and/or activities during the visit.    Electronically signed by: Laurian Brim, OA 03.24.2021 11:28 AM  Karie Chimera, M.D., Ph.D. Diseases & Surgery of the Retina and  Vitreous [facility] 08/06/2019   I have reviewed the above documentation for accuracy and completeness, and I agree with the above. Karie Chimera, M.D., Ph.D. 08/06/19 11:28 AM   Abbreviations: M myopia (nearsighted); A astigmatism; H hyperopia (farsighted); P presbyopia; Mrx spectacle prescription;  CTL contact lenses; OD right eye; OS left eye; OU both eyes  XT exotropia; ET esotropia; PEK punctate epithelial keratitis; PEE punctate epithelial erosions; DES dry eye syndrome; MGD meibomian gland dysfunction; ATs artificial tears; PFAT's preservative free artificial tears; [place] nuclear sclerotic cataract; PSC posterior subcapsular cataract; ERM epi-retinal membrane; PVD posterior vitreous detachment; RD retinal detachment; DM diabetes mellitus; DR diabetic retinopathy; NPDR non-proliferative diabetic retinopathy; PDR proliferative diabetic retinopathy; CSME clinically significant macular edema; DME diabetic macular edema; dbh dot blot hemorrhages; CWS cotton wool spot; POAG primary open angle glaucoma; C/D cup-to-disc ratio; HVF humphrey visual field; GVF goldmann visual field; OCT optical coherence tomography; IOP intraocular pressure; BRVO Branch retinal vein occlusion; CRVO central retinal vein occlusion; CRAO central retinal artery occlusion; BRAO branch retinal artery occlusion; RT retinal tear; SB scleral buckle; PPV pars plana vitrectomy; VH Vitreous hemorrhage; PRP panretinal laser photocoagulation; IVK intravitreal kenalog; VMT vitreomacular traction; MH Macular hole;  NVD neovascularization of the disc; NVE neovascularization elsewhere; AREDS age related eye disease study; ARMD age related macular degeneration; POAG primary open angle glaucoma; EBMD epithelial/anterior basement membrane dystrophy; ACIOL anterior chamber intraocular lens; IOL intraocular lens; PCIOL posterior chamber intraocular lens; Phaco/IOL phacoemulsification with intraocular lens placement; PRK  photorefractive keratectomy; LASIK laser assisted in situ keratomileusis; HTN hypertension; DM diabetes mellitus; COPD chronic obstructive pulmonary disease

## 2019-08-06 ENCOUNTER — Ambulatory Visit (INDEPENDENT_AMBULATORY_CARE_PROVIDER_SITE_OTHER): Payer: Medicaid Other | Admitting: Ophthalmology

## 2019-08-06 ENCOUNTER — Encounter (INDEPENDENT_AMBULATORY_CARE_PROVIDER_SITE_OTHER): Payer: Self-pay | Admitting: Ophthalmology

## 2019-08-06 DIAGNOSIS — H3581 Retinal edema: Secondary | ICD-10-CM

## 2019-08-06 DIAGNOSIS — B20 Human immunodeficiency virus [HIV] disease: Secondary | ICD-10-CM

## 2019-08-06 DIAGNOSIS — E119 Type 2 diabetes mellitus without complications: Secondary | ICD-10-CM

## 2019-08-06 DIAGNOSIS — H25813 Combined forms of age-related cataract, bilateral: Secondary | ICD-10-CM

## 2019-08-06 DIAGNOSIS — H40053 Ocular hypertension, bilateral: Secondary | ICD-10-CM

## 2019-08-06 DIAGNOSIS — H33301 Unspecified retinal break, right eye: Secondary | ICD-10-CM | POA: Diagnosis not present

## 2019-08-06 DIAGNOSIS — H35033 Hypertensive retinopathy, bilateral: Secondary | ICD-10-CM

## 2019-08-06 DIAGNOSIS — I10 Essential (primary) hypertension: Secondary | ICD-10-CM

## 2019-11-13 ENCOUNTER — Other Ambulatory Visit (INDEPENDENT_AMBULATORY_CARE_PROVIDER_SITE_OTHER): Payer: Self-pay | Admitting: Ophthalmology

## 2020-08-03 NOTE — Progress Notes (Shared)
[facility] - Clinic Note  08/06/2020     CHIEF COMPLAINT Patient presents for No chief complaint on file.   HISTORY OF PRESENT ILLNESS: Lynn Bates is a 50 y.o. female who presents to the clinic today for:   pt states she is still using latanoprost at night, but has not used it in a couple days  Referring physician: [name], PA-C [address] [place],  [place] [address]  HISTORICAL INFORMATION:   Selected notes from the MEDICAL RECORD NUMBER Referred by [name], PA-C for concern of pigmented holes OD LEE:08.05.20 (A. Lundquist) [BCVA: 20/20 OU]  Ocular Hx-glaucoma suspect, DES, NS PMH-DM, HTN, blood clots   CURRENT MEDICATIONS: Current Outpatient Medications (Ophthalmic Drugs)  Medication Sig  . latanoprost (XALATAN) 0.005 % ophthalmic solution Place 1 drop into both eyes at bedtime.   No current facility-administered medications for this visit. (Ophthalmic Drugs)   Current Outpatient Medications (Other)  Medication Sig  . albuterol (PROAIR HFA) 108 (90 Base) MCG/ACT inhaler Inhale 1 puff into the lungs as directed.  Marland Kitchen amLODipine (NORVASC) 10 MG tablet Take 10 mg by mouth at bedtime.  Marland Kitchen apixaban (ELIQUIS) 5 MG TABS tablet Take 5 mg by mouth in the morning and at bedtime.  . budesonide-formoterol (SYMBICORT) 160-4.5 MCG/ACT inhaler Inhale 1 puff into the lungs in the morning and at bedtime.  Marland Kitchen emtricitabine-tenofovir (TRUVADA) 200-300 MG tablet Take by mouth as directed.  . famotidine (PEPCID) 20 MG tablet Take 20 mg by mouth in the morning and at bedtime.  . furosemide (LASIX) 20 MG tablet Take 20 mg by mouth in the morning and at bedtime.  Marland Kitchen linaclotide (LINZESS) 145 MCG CAPS capsule Take 145 mcg by mouth daily.  . methocarbamol (ROBAXIN) 750 MG tablet Take 750 mg by mouth in the morning, at noon, and at bedtime.  . metoprolol tartrate (LOPRESSOR) 50 MG tablet Take 50 mg by mouth in the morning and at bedtime.  . montelukast  (SINGULAIR) 10 MG tablet Take 10 mg by mouth as directed.  . pantoprazole (PROTONIX) 40 MG tablet Take 40 mg by mouth daily.  . pravastatin (PRAVACHOL) 20 MG tablet Take 20 mg by mouth daily.  . predniSONE (DELTASONE) 10 MG tablet Take 10 mg by mouth as directed.  . raltegravir (ISENTRESS) 400 MG tablet Take 400 mg by mouth in the morning and at bedtime.  . ranitidine (ZANTAC) 300 MG tablet Take 300 mg by mouth as directed.  . risperiDONE (RISPERDAL) 2 MG tablet Take 2 mg by mouth daily.  . rosuvastatin (CRESTOR) 10 MG tablet Take 10 mg by mouth at bedtime.  . Semaglutide,0.25 or 0.5MG /DOS, (OZEMPIC, 0.25 OR 0.5 MG/DOSE,) 2 MG/1.5ML SOPN Inject 0.25 mg into the skin as directed.  . Tiotropium Bromide Monohydrate 2.5 MCG/ACT AERS Inhale 2 puffs into the lungs daily.  Marland Kitchen tiZANidine (ZANAFLEX) 2 MG tablet Take 2 mg by mouth as directed.  . topiramate (TOPAMAX) 25 MG capsule Take 25 mg by mouth in the morning and at bedtime.  . traZODone (DESYREL) 100 MG tablet Take 100 mg by mouth at bedtime.  . triamcinolone cream (KENALOG) 0.1 % Apply 1 application topically in the morning and at bedtime.   No current facility-administered medications for this visit. (Other)      REVIEW OF SYSTEMS:    ALLERGIES No Known Allergies  PAST MEDICAL HISTORY No past medical history on file. No past surgical history on file.  FAMILY HISTORY Family History  Problem  Relation Age of Onset  . Glaucoma Father   . Glaucoma Sister   . Diabetes Other   . Diabetes Maternal Aunt     SOCIAL HISTORY Social History   Tobacco Use  . Smoking status: Current Every Day Smoker  . Smokeless tobacco: Current User  Substance Use Topics  . Drug use: Never         OPHTHALMIC EXAM:  Not recorded     IMAGING AND PROCEDURES  Imaging and Procedures for @TODAY @           ASSESSMENT/PLAN:    ICD-10-CM   1. 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 Retinal edema  H35.81   3. Diabetes mellitus type 2 without  retinopathy (HCC)  E11.9   4. Essential hypertension  I10   5. Hypertensive retinopathy of both eyes  H35.033   6. Combined forms of age-related cataract of both eyes  H25.813   7. HIV disease (HCC)  B20   8. 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 Retinal defect OD  - focal pigmented retinal defects/CR scars located at 0600, equator  - asymptomatic  - no surrounding SRF   - s/p laser retinopexy OD (09.18.20) -- good laser changes surrounding lesions  - f/u 1 year, sooner prn  2. No retinal edema on exam or OCT  3. Diabetes mellitus, type 2 without retinopathy  - The incidence, risk factors for progression, natural history and treatment options for diabetic retinopathy  were discussed with patient.    - The need for close monitoring of blood glucose, blood pressure, and serum lipids, avoiding cigarette or any type of tobacco, and the need for long term follow up was also discussed with patient.  - f/u in 1 year, sooner prn   4,5. Hypertensive retinopathy OU  - discussed importance of tight BP control  - monitor  6. Mixed form age related cataracts OU  - mild  - The symptoms of cataract, surgical options, and treatments and risks were discussed with patient.  - discussed diagnosis and progression  - not yet visually significant  - monitor for now  7. 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 Ocular hypertension OU  - IOP 26 OD and 25 OS  - pt reports she has not used Latanoprost for several days  - discussed importance of compliance with drops for vision and ocular health  - continue latanaprost qhs OU   Ophthalmic Meds Ordered this visit:  No orders of the defined types were placed in this encounter.      No follow-ups on file.  There are no Patient Instructions on file for this visit.  This document serves as a record of services personally performed by 09.20.20, MD, PhD. It was created on their behalf by Karie Chimera. Glee Arvin, OA an  ophthalmic technician. The creation of this record is the provider's dictation and/or activities during the visit.    Electronically signed by: Manson Passey. Glee Arvin, Manson Passey 03.22.2022 11:17 AM  03.24.2022, M.D., Ph.D. Diseases & Surgery of the Retina and Vitreous [facility]   Abbreviations: M myopia (nearsighted); A astigmatism; H hyperopia (farsighted); P presbyopia; Mrx spectacle prescription;  CTL contact lenses; OD right eye; OS left eye; OU both eyes  XT exotropia; ET esotropia; PEK punctate epithelial keratitis; PEE punctate epithelial erosions; DES dry eye syndrome; MGD meibomian gland dysfunction; ATs artificial tears; PFAT's preservative free artificial tears; NSC nuclear sclerotic  cataract; PSC posterior subcapsular cataract; ERM epi-retinal membrane; PVD posterior vitreous detachment; RD retinal detachment; DM diabetes mellitus; DR diabetic retinopathy; NPDR non-proliferative diabetic retinopathy; PDR proliferative diabetic retinopathy; CSME clinically significant macular edema; DME diabetic macular edema; dbh dot blot hemorrhages; CWS cotton wool spot; POAG primary open angle glaucoma; C/D cup-to-disc ratio; HVF humphrey visual field; GVF goldmann visual field; OCT optical coherence tomography; IOP intraocular pressure; BRVO Branch retinal vein occlusion; CRVO central retinal vein occlusion; CRAO central retinal artery occlusion; BRAO branch retinal artery occlusion; RT retinal tear; SB scleral buckle; PPV pars plana vitrectomy; VH Vitreous hemorrhage; PRP panretinal laser photocoagulation; IVK intravitreal kenalog; VMT vitreomacular traction; MH Macular hole;  NVD neovascularization of the disc; NVE neovascularization elsewhere; AREDS age related eye disease study; ARMD age related macular degeneration; POAG primary open angle glaucoma; EBMD epithelial/anterior basement membrane dystrophy; ACIOL anterior chamber intraocular lens; IOL intraocular lens; PCIOL posterior  chamber intraocular lens; Phaco/IOL phacoemulsification with intraocular lens placement; PRK photorefractive keratectomy; LASIK laser assisted in situ keratomileusis; HTN hypertension; DM diabetes mellitus; COPD chronic obstructive pulmonary disease

## 2020-08-06 ENCOUNTER — Encounter (INDEPENDENT_AMBULATORY_CARE_PROVIDER_SITE_OTHER): Payer: Medicaid Other | Admitting: Ophthalmology

## 2020-08-06 DIAGNOSIS — B20 Human immunodeficiency virus [HIV] disease: Secondary | ICD-10-CM

## 2020-08-06 DIAGNOSIS — H33301 Unspecified retinal break, right eye: Secondary | ICD-10-CM

## 2020-08-06 DIAGNOSIS — E119 Type 2 diabetes mellitus without complications: Secondary | ICD-10-CM

## 2020-08-06 DIAGNOSIS — H3581 Retinal edema: Secondary | ICD-10-CM

## 2020-08-06 DIAGNOSIS — H35033 Hypertensive retinopathy, bilateral: Secondary | ICD-10-CM

## 2020-08-06 DIAGNOSIS — I10 Essential (primary) hypertension: Secondary | ICD-10-CM

## 2020-08-06 DIAGNOSIS — H40053 Ocular hypertension, bilateral: Secondary | ICD-10-CM

## 2020-08-06 DIAGNOSIS — H25813 Combined forms of age-related cataract, bilateral: Secondary | ICD-10-CM

## 2020-09-03 ENCOUNTER — Telehealth: Payer: Self-pay

## 2020-09-10 ENCOUNTER — Telehealth: Payer: Self-pay

## 2020-09-14 ENCOUNTER — Telehealth: Payer: Self-pay

## 2020-09-20 NOTE — Progress Notes (Shared)
[facility] - Clinic Note  09/23/2020     CHIEF COMPLAINT Patient presents for No chief complaint on file.   HISTORY OF PRESENT ILLNESS: Lynn Bates is a 50 y.o. female who presents to the clinic today for:   pt states she is still using latanoprost at night, but has not used it in a couple days  Referring physician: [name], PA-C [address] [place],  [place] [address]  HISTORICAL INFORMATION:   Selected notes from the MEDICAL RECORD NUMBER Referred by [name], PA-C for concern of pigmented holes OD LEE:08.05.20 (A. Lundquist) [BCVA: 20/20 OU]  Ocular Hx-glaucoma suspect, DES, NS PMH-DM, HTN, blood clots   CURRENT MEDICATIONS: Current Outpatient Medications (Ophthalmic Drugs)  Medication Sig  . latanoprost (XALATAN) 0.005 % ophthalmic solution Place 1 drop into both eyes at bedtime.   No current facility-administered medications for this visit. (Ophthalmic Drugs)   Current Outpatient Medications (Other)  Medication Sig  . albuterol (PROAIR HFA) 108 (90 Base) MCG/ACT inhaler Inhale 1 puff into the lungs as directed.  Marland Kitchen amLODipine (NORVASC) 10 MG tablet Take 10 mg by mouth at bedtime.  Marland Kitchen apixaban (ELIQUIS) 5 MG TABS tablet Take 5 mg by mouth in the morning and at bedtime.  . budesonide-formoterol (SYMBICORT) 160-4.5 MCG/ACT inhaler Inhale 1 puff into the lungs in the morning and at bedtime.  Marland Kitchen emtricitabine-tenofovir (TRUVADA) 200-300 MG tablet Take by mouth as directed.  . famotidine (PEPCID) 20 MG tablet Take 20 mg by mouth in the morning and at bedtime.  . furosemide (LASIX) 20 MG tablet Take 20 mg by mouth in the morning and at bedtime.  Marland Kitchen linaclotide (LINZESS) 145 MCG CAPS capsule Take 145 mcg by mouth daily.  . methocarbamol (ROBAXIN) 750 MG tablet Take 750 mg by mouth in the morning, at noon, and at bedtime.  . metoprolol tartrate (LOPRESSOR) 50 MG tablet Take 50 mg by mouth in the morning and at bedtime.  . montelukast  (SINGULAIR) 10 MG tablet Take 10 mg by mouth as directed.  . pantoprazole (PROTONIX) 40 MG tablet Take 40 mg by mouth daily.  . pravastatin (PRAVACHOL) 20 MG tablet Take 20 mg by mouth daily.  . predniSONE (DELTASONE) 10 MG tablet Take 10 mg by mouth as directed.  . raltegravir (ISENTRESS) 400 MG tablet Take 400 mg by mouth in the morning and at bedtime.  . ranitidine (ZANTAC) 300 MG tablet Take 300 mg by mouth as directed.  . risperiDONE (RISPERDAL) 2 MG tablet Take 2 mg by mouth daily.  . rosuvastatin (CRESTOR) 10 MG tablet Take 10 mg by mouth at bedtime.  . Semaglutide,0.25 or 0.5MG /DOS, (OZEMPIC, 0.25 OR 0.5 MG/DOSE,) 2 MG/1.5ML SOPN Inject 0.25 mg into the skin as directed.  . Tiotropium Bromide Monohydrate 2.5 MCG/ACT AERS Inhale 2 puffs into the lungs daily.  Marland Kitchen tiZANidine (ZANAFLEX) 2 MG tablet Take 2 mg by mouth as directed.  . topiramate (TOPAMAX) 25 MG capsule Take 25 mg by mouth in the morning and at bedtime.  . traZODone (DESYREL) 100 MG tablet Take 100 mg by mouth at bedtime.  . triamcinolone cream (KENALOG) 0.1 % Apply 1 application topically in the morning and at bedtime.   No current facility-administered medications for this visit. (Other)      REVIEW OF SYSTEMS:    ALLERGIES No Known Allergies  PAST MEDICAL HISTORY No past medical history on file. No past surgical history on file.  FAMILY HISTORY Family History  Problem  Relation Age of Onset  . Glaucoma Father   . Glaucoma Sister   . Diabetes Other   . Diabetes Maternal Aunt     SOCIAL HISTORY Social History   Tobacco Use  . Smoking status: Current Every Day Smoker  . Smokeless tobacco: Current User  Substance Use Topics  . Drug use: Never         OPHTHALMIC EXAM:  Not recorded     IMAGING AND PROCEDURES  Imaging and Procedures for @TODAY @           ASSESSMENT/PLAN:    ICD-10-CM   1. 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 Retinal edema  H35.81   3. Diabetes mellitus type 2 without  retinopathy (HCC)  E11.9   4. Essential hypertension  I10   5. Hypertensive retinopathy of both eyes  H35.033   6. Combined forms of age-related cataract of both eyes  H25.813   7. HIV disease (HCC)  B20   8. 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 Retinal defect OD  - focal pigmented retinal defects/CR scars located at 0600, equator  - asymptomatic  - no surrounding SRF   - s/p laser retinopexy OD (09.18.20) -- good laser changes surrounding lesions   - f/u 1 year, sooner prn  2. No retinal edema on exam or OCT  3. Diabetes mellitus, type 2 without retinopathy  - The incidence, risk factors for progression, natural history and treatment options for diabetic retinopathy  were discussed with patient.  - The need for close monitoring of blood glucose, blood pressure, and serum lipids, avoiding cigarette or any type of tobacco, and the need for long term follow up was also discussed with patient.  - f/u in 1 year, sooner prn   4,5. Hypertensive retinopathy OU  - discussed importance of tight BP control  - monitor   6. Mixed form age related cataracts OU  - mild  - The symptoms of cataract, surgical options, and treatments and risks were discussed with patient.  - discussed diagnosis and progression  - not yet visually significant  - monitor for now  7. 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 Ocular hypertension OU  - IOP 26 OD and 25 OS  - pt reports she has not used Latanoprost for several days  - discussed importance of compliance with drops for vision and ocular health   - continue latanaprost qhs OU   Ophthalmic Meds Ordered this visit:  No orders of the defined types were placed in this encounter.      No follow-ups on file.  There are no Patient Instructions on file for this visit.  This document serves as a record of services personally performed by 09.20.20, MD, PhD. It was created on their behalf by Karie Chimera, an  ophthalmic technician. The creation of this record is the provider's dictation and/or activities during the visit.    Electronically signed by: Joni Reining COA, 09/20/20  10:31 AM  11/20/20, M.D., Ph.D. Diseases & Surgery of the Retina and Vitreous [facility] 09/23/2020    Abbreviations: M myopia (nearsighted); A astigmatism; H hyperopia (farsighted); P presbyopia; Mrx spectacle prescription;  CTL contact lenses; OD right eye; OS left eye; OU both eyes  XT exotropia; ET esotropia; PEK punctate epithelial keratitis; PEE punctate epithelial erosions; DES dry eye syndrome; MGD meibomian gland dysfunction; ATs artificial tears; PFAT's preservative free artificial tears; NSC nuclear  sclerotic cataract; PSC posterior subcapsular cataract; ERM epi-retinal membrane; PVD posterior vitreous detachment; RD retinal detachment; DM diabetes mellitus; DR diabetic retinopathy; NPDR non-proliferative diabetic retinopathy; PDR proliferative diabetic retinopathy; CSME clinically significant macular edema; DME diabetic macular edema; dbh dot blot hemorrhages; CWS cotton wool spot; POAG primary open angle glaucoma; C/D cup-to-disc ratio; HVF humphrey visual field; GVF goldmann visual field; OCT optical coherence tomography; IOP intraocular pressure; BRVO Branch retinal vein occlusion; CRVO central retinal vein occlusion; CRAO central retinal artery occlusion; BRAO branch retinal artery occlusion; RT retinal tear; SB scleral buckle; PPV pars plana vitrectomy; VH Vitreous hemorrhage; PRP panretinal laser photocoagulation; IVK intravitreal kenalog; VMT vitreomacular traction; MH Macular hole;  NVD neovascularization of the disc; NVE neovascularization elsewhere; AREDS age related eye disease study; ARMD age related macular degeneration; POAG primary open angle glaucoma; EBMD epithelial/anterior basement membrane dystrophy; ACIOL anterior chamber intraocular lens; IOL intraocular lens; PCIOL  posterior chamber intraocular lens; Phaco/IOL phacoemulsification with intraocular lens placement; PRK photorefractive keratectomy; LASIK laser assisted in situ keratomileusis; HTN hypertension; DM diabetes mellitus; COPD chronic obstructive pulmonary disease

## 2020-09-23 ENCOUNTER — Encounter (INDEPENDENT_AMBULATORY_CARE_PROVIDER_SITE_OTHER): Payer: Medicaid Other | Admitting: Ophthalmology

## 2020-09-23 DIAGNOSIS — H35033 Hypertensive retinopathy, bilateral: Secondary | ICD-10-CM

## 2020-09-23 DIAGNOSIS — H3581 Retinal edema: Secondary | ICD-10-CM

## 2020-09-23 DIAGNOSIS — H33301 Unspecified retinal break, right eye: Secondary | ICD-10-CM

## 2020-09-23 DIAGNOSIS — B20 Human immunodeficiency virus [HIV] disease: Secondary | ICD-10-CM

## 2020-09-23 DIAGNOSIS — E119 Type 2 diabetes mellitus without complications: Secondary | ICD-10-CM

## 2020-09-23 DIAGNOSIS — H25813 Combined forms of age-related cataract, bilateral: Secondary | ICD-10-CM

## 2020-09-23 DIAGNOSIS — H40053 Ocular hypertension, bilateral: Secondary | ICD-10-CM

## 2020-09-23 DIAGNOSIS — I10 Essential (primary) hypertension: Secondary | ICD-10-CM

## 2020-10-01 ENCOUNTER — Telehealth: Payer: Self-pay

## 2020-10-27 ENCOUNTER — Other Ambulatory Visit (HOSPITAL_COMMUNITY)
Admission: RE | Admit: 2020-10-27 | Discharge: 2020-10-27 | Disposition: A | Discharge status: Home / Self Care | Payer: Medicaid Other | Source: Ambulatory Visit | Attending: Obstetrics and Gynecology | Admitting: Obstetrics and Gynecology

## 2020-10-27 ENCOUNTER — Ambulatory Visit (INDEPENDENT_AMBULATORY_CARE_PROVIDER_SITE_OTHER): Payer: Medicaid Other | Admitting: Obstetrics and Gynecology

## 2020-10-27 ENCOUNTER — Encounter: Payer: Self-pay | Admitting: Obstetrics and Gynecology

## 2020-10-27 ENCOUNTER — Other Ambulatory Visit: Payer: Self-pay

## 2020-10-27 VITALS — BP 113/78 | HR 89 | Ht 65.0 in | Wt 259.0 lb

## 2020-10-27 DIAGNOSIS — Z1231 Encounter for screening mammogram for malignant neoplasm of breast: Secondary | ICD-10-CM | POA: Diagnosis not present

## 2020-10-27 DIAGNOSIS — Z8619 Personal history of other infectious and parasitic diseases: Secondary | ICD-10-CM | POA: Diagnosis not present

## 2020-10-27 DIAGNOSIS — Z01419 Encounter for gynecological examination (general) (routine) without abnormal findings: Secondary | ICD-10-CM | POA: Insufficient documentation

## 2020-10-27 DIAGNOSIS — Z124 Encounter for screening for malignant neoplasm of cervix: Secondary | ICD-10-CM

## 2020-10-27 MED ORDER — VALACYCLOVIR HCL 500 MG PO TABS: 500.0000 mg | ORAL_TABLET | Freq: Every day | ORAL | 12 refills | Status: DC

## 2020-10-27 NOTE — Progress Notes (Signed)
Lynn Bates is a 50 y.o. G76P0 female here for a routine annual gynecologic exam.  Current complaints: H/O HSV.  Last outbreak 1 month ago.  Denies abnormal vaginal bleeding, discharge, not sexual active.  H/O HIV followed by ID    Gynecologic History No LMP recorded. Contraception: abstinence Last Pap: uncertain. Results were: normal Last mammogram: 08/2020. Results were: normal  Obstetric History OB History  Gravida Para Term Preterm AB Living  7         7  SAB IAB Ectopic Multiple Live Births               # Outcome Date GA Lbr Len/2nd Weight Sex Delivery Anes PTL Lv  7 Gravida           6 Gravida           5 Gravida           4 Gravida           3 Gravida           2 Gravida           1 Gravida             Obstetric Comments  One child passed 2019  4 C-Sections     Past Medical History:  Diagnosis Date   Hypertension     Past Surgical History:  Procedure Laterality Date   CESAREAN SECTION     PACEMAKER IMPLANT     2009    Current Outpatient Medications on File Prior to Visit  Medication Sig Dispense Refill   albuterol (VENTOLIN HFA) 108 (90 Base) MCG/ACT inhaler Inhale 1 puff into the lungs as directed.     amLODipine (NORVASC) 10 MG tablet Take 10 mg by mouth at bedtime.     apixaban (ELIQUIS) 5 MG TABS tablet Take 5 mg by mouth in the morning and at bedtime.     budesonide-formoterol (SYMBICORT) 160-4.5 MCG/ACT inhaler Inhale 1 puff into the lungs in the morning and at bedtime.     emtricitabine-tenofovir (TRUVADA) 200-300 MG tablet Take by mouth as directed.     famotidine (PEPCID) 20 MG tablet Take 20 mg by mouth in the morning and at bedtime.     furosemide (LASIX) 20 MG tablet Take 20 mg by mouth in the morning and at bedtime.     latanoprost (XALATAN) 0.005 % ophthalmic solution Place 1 drop into both eyes at bedtime.     linaclotide (LINZESS) 145 MCG CAPS capsule Take 145 mcg by mouth daily.     methocarbamol (ROBAXIN) 750 MG tablet Take 750 mg by  mouth in the morning, at noon, and at bedtime.     metoprolol tartrate (LOPRESSOR) 50 MG tablet Take 50 mg by mouth in the morning and at bedtime.     montelukast (SINGULAIR) 10 MG tablet Take 10 mg by mouth as directed.     pantoprazole (PROTONIX) 40 MG tablet Take 40 mg by mouth daily.     pravastatin (PRAVACHOL) 20 MG tablet Take 20 mg by mouth daily.     predniSONE (DELTASONE) 10 MG tablet Take 10 mg by mouth as directed.     raltegravir (ISENTRESS) 400 MG tablet Take 400 mg by mouth in the morning and at bedtime.     ranitidine (ZANTAC) 300 MG tablet Take 300 mg by mouth as directed.     risperiDONE (RISPERDAL) 2 MG tablet Take 2 mg by mouth daily.     rosuvastatin (CRESTOR) 10 MG  tablet Take 10 mg by mouth at bedtime.     Semaglutide,0.25 or 0.5MG /DOS, (OZEMPIC, 0.25 OR 0.5 MG/DOSE,) 2 MG/1.5ML SOPN Inject 0.25 mg into the skin as directed.     Tiotropium Bromide Monohydrate 2.5 MCG/ACT AERS Inhale 2 puffs into the lungs daily.     tiZANidine (ZANAFLEX) 2 MG tablet Take 2 mg by mouth as directed.     topiramate (TOPAMAX) 25 MG capsule Take 25 mg by mouth in the morning and at bedtime.     traZODone (DESYREL) 100 MG tablet Take 100 mg by mouth at bedtime.     triamcinolone cream (KENALOG) 0.1 % Apply 1 application topically in the morning and at bedtime.     No current facility-administered medications on file prior to visit.    No Known Allergies  Social History   Socioeconomic History   Marital status: Single    Spouse name: Not on file   Number of children: Not on file   Years of education: Not on file   Highest education level: Not on file  Occupational History   Not on file  Tobacco Use   Smoking status: Every Day    Pack years: 0.00   Smokeless tobacco: Current  Vaping Use   Vaping Use: Never used  Substance and Sexual Activity   Alcohol use: Not Currently   Drug use: Never   Sexual activity: Not Currently    Partners: Male  Other Topics Concern   Not on file   Social History Narrative   Not on file   Social Determinants of Health   Financial Resource Strain: Not on file  Food Insecurity: Not on file  Transportation Needs: Not on file  Physical Activity: Not on file  Stress: Not on file  Social Connections: Not on file  Intimate Partner Violence: Not on file    Family History  Problem Relation Age of Onset   Glaucoma Father    Glaucoma Sister    Diabetes Other    Diabetes Maternal Aunt     The following portions of the patient's history were reviewed and updated as appropriate: allergies, current medications, past family history, past medical history, past social history, past surgical history and problem list.  Review of Systems Pertinent items noted in HPI and remainder of comprehensive ROS otherwise negative.   Objective:  BP 113/78   Pulse 89   Ht 5\' 5"  (1.651 m)   Wt 259 lb (117.5 kg)   BMI 43.10 kg/m  CONSTITUTIONAL: Well-developed, well-nourished female in no acute distress.  HENT:  Normocephalic, atraumatic, External right and left ear normal. Oropharynx is clear and moist EYES: Conjunctivae and EOM are normal. Pupils are equal, round, and reactive to light. No scleral icterus.  NECK: Normal range of motion, supple, no masses.  Normal thyroid.  SKIN: Skin is warm and dry. No rash noted. Not diaphoretic. No erythema. No pallor. NEUROLGIC: Alert and oriented to person, place, and time. Normal reflexes, muscle tone coordination. No cranial nerve deficit noted. PSYCHIATRIC: Normal mood and affect. Normal behavior. Normal judgment and thought content. CARDIOVASCULAR: Normal heart rate noted, regular rhythm RESPIRATORY: Clear to auscultation bilaterally. Effort and breath sounds normal, no problems with respiration noted. BREASTS: Symmetric in size. No masses, skin changes, nipple drainage, or lymphadenopathy. ABDOMEN: Soft, normal bowel sounds, no distention noted.  No tenderness, rebound or guarding.  PELVIC: Normal  appearing external genitalia; normal appearing vaginal mucosa and cervix.  No abnormal discharge noted.  Pap smear obtained.  Normal uterine  size, no other palpable masses, no uterine or adnexal tenderness. MUSCULOSKELETAL: Normal range of motion. No tenderness.  No cyanosis, clubbing, or edema.  2+ distal pulses.   Assessment:  Annual gynecologic examination with pap smear H/O HSV Plan:  Will follow up results of pap smear and manage accordingly. Valtrex suppression started Routine preventative health maintenance measures emphasized. Please refer to After Visit Summary for other counseling recommendations.    Hermina Staggers, MD, FACOG Attending Obstetrician & Gynecologist Center for [facility], [facility]

## 2020-10-27 NOTE — Progress Notes (Signed)
NGYN +HIV referral patient presents for visit to establish care and discuss Genital warts. Per office notes from [place] on 08/13/20 Pt c/o bumps ad itching in the vaginal area Pt also noted bumps are painful. however bumps are not there today but was a month ago per pt.  *Looking in labs pt was + for HSV 1 &2 on 08/08/19.  Pt denies vaginal discharge , and irritation today.   Pt still wants exam today.  Last Mammogram: per pt was done in 04/22 with [facility]

## 2020-10-27 NOTE — Patient Instructions (Signed)
Health Maintenance, Female Adopting a healthy lifestyle and getting preventive care are important in promoting health and wellness. Ask your health care provider about: The right schedule for you to have regular tests and exams. Things you can do on your own to prevent diseases and keep yourself healthy. What should I know about diet, weight, and exercise? Eat a healthy diet  Eat a diet that includes plenty of vegetables, fruits, low-fat dairy products, and lean protein. Do not eat a lot of foods that are high in solid fats, added sugars, or sodium.  Maintain a healthy weight Body mass index (BMI) is used to identify weight problems. It estimates body fat based on height and weight. Your health care provider can help determineyour BMI and help you achieve or maintain a healthy weight. Get regular exercise Get regular exercise. This is one of the most important things you can do for your health. Most adults should: Exercise for at least 150 minutes each week. The exercise should increase your heart rate and make you sweat (moderate-intensity exercise). Do strengthening exercises at least twice a week. This is in addition to the moderate-intensity exercise. Spend less time sitting. Even light physical activity can be beneficial. Watch cholesterol and blood lipids Have your blood tested for lipids and cholesterol at 50 years of age, then havethis test every 5 years. Have your cholesterol levels checked more often if: Your lipid or cholesterol levels are high. You are older than 50 years of age. You are at high risk for heart disease. What should I know about cancer screening? Depending on your health history and family history, you may need to have cancer screening at various ages. This may include screening for: Breast cancer. Cervical cancer. Colorectal cancer. Skin cancer. Lung cancer. What should I know about heart disease, diabetes, and high blood pressure? Blood pressure and heart  disease High blood pressure causes heart disease and increases the risk of stroke. This is more likely to develop in people who have high blood pressure readings, are of African descent, or are overweight. Have your blood pressure checked: Every 3-5 years if you are 18-39 years of age. Every year if you are 40 years old or older. Diabetes Have regular diabetes screenings. This checks your fasting blood sugar level. Have the screening done: Once every three years after age 40 if you are at a normal weight and have a low risk for diabetes. More often and at a younger age if you are overweight or have a high risk for diabetes. What should I know about preventing infection? Hepatitis B If you have a higher risk for hepatitis B, you should be screened for this virus. Talk with your health care provider to find out if you are at risk forhepatitis B infection. Hepatitis C Testing is recommended for: Everyone born from 1945 through 1965. Anyone with known risk factors for hepatitis C. Sexually transmitted infections (STIs) Get screened for STIs, including gonorrhea and chlamydia, if: You are sexually active and are younger than 50 years of age. You are older than 50 years of age and your health care provider tells you that you are at risk for this type of infection. Your sexual activity has changed since you were last screened, and you are at increased risk for chlamydia or gonorrhea. Ask your health care provider if you are at risk. Ask your health care provider about whether you are at high risk for HIV. Your health care provider may recommend a prescription medicine to help   prevent HIV infection. If you choose to take medicine to prevent HIV, you should first get tested for HIV. You should then be tested every 3 months for as long as you are taking the medicine. Pregnancy If you are about to stop having your period (premenopausal) and you may become pregnant, seek counseling before you get  pregnant. Take 400 to 800 micrograms (mcg) of folic acid every day if you become pregnant. Ask for birth control (contraception) if you want to prevent pregnancy. Osteoporosis and menopause Osteoporosis is a disease in which the bones lose minerals and strength with aging. This can result in bone fractures. If you are 65 years old or older, or if you are at risk for osteoporosis and fractures, ask your health care provider if you should: Be screened for bone loss. Take a calcium or vitamin D supplement to lower your risk of fractures. Be given hormone replacement therapy (HRT) to treat symptoms of menopause. Follow these instructions at home: Lifestyle Do not use any products that contain nicotine or tobacco, such as cigarettes, e-cigarettes, and chewing tobacco. If you need help quitting, ask your health care provider. Do not use street drugs. Do not share needles. Ask your health care provider for help if you need support or information about quitting drugs. Alcohol use Do not drink alcohol if: Your health care provider tells you not to drink. You are pregnant, may be pregnant, or are planning to become pregnant. If you drink alcohol: Limit how much you use to 0-1 drink a day. Limit intake if you are breastfeeding. Be aware of how much alcohol is in your drink. In the [place] one drink equals one 12 oz bottle of beer (355 mL), one 5 oz glass of wine (148 mL), or one 1 oz glass of hard liquor (44 mL). General instructions Schedule regular health, dental, and eye exams. Stay current with your vaccines. Tell your health care provider if: You often feel depressed. You have ever been abused or do not feel safe at home. Summary Adopting a healthy lifestyle and getting preventive care are important in promoting health and wellness. Follow your health care provider's instructions about healthy diet, exercising, and getting tested or screened for diseases. Follow your health care provider's  instructions on monitoring your cholesterol and blood pressure. This information is not intended to replace advice given to you by your health care provider. Make sure you discuss any questions you have with your healthcare provider. Document Revised: 04/24/2018 Document Reviewed: 04/24/2018 Elsevier Patient Education  2022 [employer]  

## 2020-10-29 LAB — CYTOLOGY - PAP
Adequacy: ABSENT
Comment: NEGATIVE
Diagnosis: NEGATIVE
High risk HPV: NEGATIVE

## 2021-05-14 ENCOUNTER — Other Ambulatory Visit: Payer: Self-pay

## 2021-05-14 ENCOUNTER — Emergency Department (HOSPITAL_COMMUNITY)
Admission: EM | Admit: 2021-05-14 | Discharge: 2021-05-15 | Disposition: A | Discharge status: Home / Self Care | Payer: Medicaid Other | Attending: Emergency Medicine | Admitting: Emergency Medicine

## 2021-05-14 ENCOUNTER — Emergency Department (HOSPITAL_COMMUNITY): Payer: Medicaid Other

## 2021-05-14 ENCOUNTER — Encounter (HOSPITAL_COMMUNITY): Payer: Self-pay | Admitting: Emergency Medicine

## 2021-05-14 DIAGNOSIS — G8911 Acute pain due to trauma: Secondary | ICD-10-CM | POA: Diagnosis not present

## 2021-05-14 DIAGNOSIS — N9489 Other specified conditions associated with female genital organs and menstrual cycle: Secondary | ICD-10-CM | POA: Diagnosis not present

## 2021-05-14 DIAGNOSIS — R42 Dizziness and giddiness: Secondary | ICD-10-CM | POA: Diagnosis not present

## 2021-05-14 DIAGNOSIS — Z79899 Other long term (current) drug therapy: Secondary | ICD-10-CM | POA: Insufficient documentation

## 2021-05-14 DIAGNOSIS — M79644 Pain in right finger(s): Secondary | ICD-10-CM | POA: Insufficient documentation

## 2021-05-14 DIAGNOSIS — W231XXA Caught, crushed, jammed, or pinched between stationary objects, initial encounter: Secondary | ICD-10-CM | POA: Insufficient documentation

## 2021-05-14 LAB — URINALYSIS, ROUTINE W REFLEX MICROSCOPIC
Bacteria, UA: NONE SEEN
Bilirubin Urine: NEGATIVE
Glucose, UA: >=500 mg/dL — AB
Ketones, ur: NEGATIVE mg/dL
Leukocytes,Ua: NEGATIVE
Nitrite: NEGATIVE
Protein, ur: NEGATIVE mg/dL
Specific Gravity, Urine: 1.027 (ref 1.005–1.030)
pH: 5 (ref 5.0–8.0)

## 2021-05-14 LAB — CBC WITH DIFFERENTIAL/PLATELET
Abs Immature Granulocytes: 0 10*3/uL (ref 0.00–0.07)
Basophils Absolute: 0 10*3/uL (ref 0.0–0.1)
Basophils Relative: 0 %
Eosinophils Absolute: 0.5 10*3/uL (ref 0.0–0.5)
Eosinophils Relative: 7 %
HCT: 46 % (ref 36.0–46.0)
Hemoglobin: 14.8 g/dL (ref 12.0–15.0)
Immature Granulocytes: 0 %
Lymphocytes Relative: 58 %
Lymphs Abs: 4 10*3/uL (ref 0.7–4.0)
MCH: 26.8 pg (ref 26.0–34.0)
MCHC: 32.2 g/dL (ref 30.0–36.0)
MCV: 83.3 fL (ref 80.0–100.0)
Monocytes Absolute: 0.4 10*3/uL (ref 0.1–1.0)
Monocytes Relative: 5 %
Neutro Abs: 2.1 10*3/uL (ref 1.7–7.7)
Neutrophils Relative %: 30 %
Platelets: 226 10*3/uL (ref 150–400)
RBC: 5.52 MIL/uL — ABNORMAL HIGH (ref 3.87–5.11)
RDW: 12.2 % (ref 11.5–15.5)
WBC: 7 10*3/uL (ref 4.0–10.5)
nRBC: 0 % (ref 0.0–0.2)

## 2021-05-14 LAB — BASIC METABOLIC PANEL
Anion gap: 10 (ref 5–15)
BUN: 13 mg/dL (ref 6–20)
CO2: 27 mmol/L (ref 22–32)
Calcium: 9.9 mg/dL (ref 8.9–10.3)
Chloride: 98 mmol/L (ref 98–111)
Creatinine, Ser: 0.62 mg/dL (ref 0.44–1.00)
GFR, Estimated: >60 mL/min (ref >60)
Glucose, Bld: 423 mg/dL — ABNORMAL HIGH (ref 70–99)
Potassium: 4 mmol/L (ref 3.5–5.1)
Sodium: 135 mmol/L (ref 135–145)

## 2021-05-14 LAB — I-STAT BETA HCG BLOOD, ED (MC, WL, AP ONLY): I-stat hCG, quantitative: <5 m[IU]/mL (ref <5)

## 2021-05-14 NOTE — ED Triage Notes (Signed)
Pt reports dizziness x1 week. Pt reports being seen for same 5 months ago and given medications for dizziness but she reports they didn't work. Pt also reports injury to right thumb.

## 2021-05-14 NOTE — ED Notes (Signed)
Pt had difficulty standing after 3 minutes.

## 2021-05-14 NOTE — ED Provider Notes (Signed)
Emergency Medicine Provider Triage Evaluation Note  Lynn Bates , a 50 y.o. female  was evaluated in triage.  Pt complains of dizziness worsened with movement for the last week.  History of the same.  No focal weakness.  Additionally endorses injury to her right hand from ringing bells during the Christmas season with pain in her right middle finger on her right thumb.  Review of Systems  Positive: Dizziness, right hand pain without swelling or redness Negative: Blurry or double vision, fevers, chills, focal weakness  Physical Exam  BP (!) 142/76 (BP Location: Left Arm) Comment: Simultaneous filing. User may not have seen previous data.   Pulse 87 Comment: Simultaneous filing. User may not have seen previous data.   Temp 98.5 F (36.9 C) (Oral)    Resp 18 Comment: Simultaneous filing. User may not have seen previous data.   SpO2 94% Comment: Simultaneous filing. User may not have seen previous data. Gen:   Awake, no distress   Resp:  Normal effort  MSK:   Moves extremities without difficulty  Other:  No focal deficit on neuro exam.  No pronator drift.  Tenderness palpation over the right thumb without erythema, induration, bruising, or deformity.  PERRL, EOMI, no nystagmus, normal test of skew  Medical Decision Making  Medically screening exam initiated at 4:17 PM.  Appropriate orders placed.  Lynn Bates was informed that the remainder of the evaluation will be completed by another provider, this initial triage assessment does not replace that evaluation, and the importance of remaining in the ED until their evaluation is complete.  Based on normal neurologic exam and HINTS as well as episodic nature of patient's symptoms doubt central etiology for her dizziness.  This chart was dictated using voice recognition software, Dragon. Despite the best efforts of this provider to proofread and correct errors, errors may still occur which can change documentation meaning.    Paris Lore,  PA-C 05/14/21 1619    Mancel Bale, MD 05/14/21 2231

## 2021-05-14 NOTE — ED Notes (Signed)
Pt feeling dizzy. Unable to get orthostatic vitals at this time.

## 2021-05-15 ENCOUNTER — Emergency Department (HOSPITAL_COMMUNITY): Payer: Medicaid Other

## 2021-05-15 LAB — CBG MONITORING, ED: Glucose-Capillary: 293 mg/dL — ABNORMAL HIGH (ref 70–99)

## 2021-05-15 MED ORDER — INSULIN ASPART 100 UNIT/ML IJ SOLN
6.0000 [IU] | Freq: Once | INTRAMUSCULAR | Status: AC
  Administered 2021-05-15: 6 [IU] via SUBCUTANEOUS
  Filled 2021-05-15: qty 0.06

## 2021-05-15 MED ORDER — SODIUM CHLORIDE 0.9 % IV BOLUS
1000.0000 mL | Freq: Once | INTRAVENOUS | Status: AC
  Administered 2021-05-15: 1000 mL via INTRAVENOUS

## 2021-05-15 MED ORDER — MECLIZINE HCL 25 MG PO TABS
25.0000 mg | ORAL_TABLET | Freq: Once | ORAL | Status: AC
  Administered 2021-05-15: 25 mg via ORAL
  Filled 2021-05-15: qty 1

## 2021-05-15 MED ORDER — MECLIZINE HCL 25 MG PO TABS: 25.0000 mg | ORAL_TABLET | Freq: Three times a day (TID) | ORAL | 0 refills | Status: AC | PRN

## 2021-05-15 MED ORDER — ACETAMINOPHEN 325 MG PO TABS
650.0000 mg | ORAL_TABLET | Freq: Once | ORAL | Status: AC
  Administered 2021-05-15: 650 mg via ORAL
  Filled 2021-05-15: qty 2

## 2021-05-15 MED ORDER — SODIUM CHLORIDE 0.9 % IV BOLUS: 500.0000 mL | Freq: Once | INTRAVENOUS | Status: DC

## 2021-05-15 NOTE — ED Notes (Signed)
Pt ambulatory to restroom. Steady gait. No issues observed.

## 2021-05-15 NOTE — ED Provider Notes (Signed)
[facility] DEPT Provider Note   CSN: [id number] Arrival date & time: 05/14/21  1441     History  Chief Complaint  Patient presents with   Finger Injury   Dizziness    Lynn Bates is a 51 y.o. female.  HPI  51 year old female with a history of hokum, HIV, diabetes, hyperlipidemia, hypertension, who presents to the emergency department today for evaluation of dizziness.  States that for the last week she has had intermittent episodes of dizziness that last for about 1 minute at a time.  They are provoked by turning her head to the sides or looking up.  They resolve when sitting still.  There have been no acute vision changes and no reports of unilateral numbness or weakness.  She does have a history of migraines and has had some intermittent headaches but none currently.  She denies any lightheadedness, near-syncope, new chest pain or shortness of breath.  She denies any recent URI symptoms, fevers.  She further complains of pain to her right middle finger and right thumb.  She remembers jamming her right middle finger but does not remember any injury to her thumb.  Home Medications Prior to Admission medications   Medication Sig Start Date End Date Taking? Authorizing Provider  meclizine (ANTIVERT) 25 MG tablet Take 1 tablet (25 mg total) by mouth 3 (three) times daily as needed for dizziness. 05/15/21  Yes Taffie Eckmann S, PA-C  albuterol (VENTOLIN HFA) 108 (90 Base) MCG/ACT inhaler Inhale 1 puff into the lungs as directed. 02/16/15   [provider]  amLODipine (NORVASC) 10 MG tablet Take 10 mg by mouth at bedtime. 06/16/19   [provider]  apixaban (ELIQUIS) 5 MG TABS tablet Take 5 mg by mouth in the morning and at bedtime. 10/01/18   [provider]  budesonide-formoterol (SYMBICORT) 160-4.5 MCG/ACT inhaler Inhale 1 puff into the lungs in the morning and at bedtime.    [provider]  emtricitabine-tenofovir  (TRUVADA) 200-300 MG tablet Take by mouth as directed. 05/28/09   [provider]  famotidine (PEPCID) 20 MG tablet Take 20 mg by mouth in the morning and at bedtime. 10/10/18   [provider]  furosemide (LASIX) 20 MG tablet Take 20 mg by mouth in the morning and at bedtime. 06/16/19   [provider]  latanoprost (XALATAN) 0.005 % ophthalmic solution Place 1 drop into both eyes at bedtime. 02/02/17   [provider]  linaclotide (LINZESS) 145 MCG CAPS capsule Take 145 mcg by mouth daily. 06/05/17   [provider]  methocarbamol (ROBAXIN) 750 MG tablet Take 750 mg by mouth in the morning, at noon, and at bedtime. 06/24/19   [provider]  metoprolol tartrate (LOPRESSOR) 50 MG tablet Take 50 mg by mouth in the morning and at bedtime. 10/07/15   [provider]  montelukast (SINGULAIR) 10 MG tablet Take 10 mg by mouth as directed. 02/22/19   [provider]  pantoprazole (PROTONIX) 40 MG tablet Take 40 mg by mouth daily. 04/25/15   [provider]  pravastatin (PRAVACHOL) 20 MG tablet Take 20 mg by mouth daily. 04/17/15   [provider]  predniSONE (DELTASONE) 10 MG tablet Take 10 mg by mouth as directed. 07/13/17   [provider]  raltegravir (ISENTRESS) 400 MG tablet Take 400 mg by mouth in the morning and at bedtime. 08/03/09   [provider]  ranitidine (ZANTAC) 300 MG tablet Take 300 mg by mouth as directed.  04/30/15   [provider]  risperiDONE (RISPERDAL) 2 MG tablet Take 2 mg by mouth daily.    [provider]  rosuvastatin (CRESTOR) 10 MG tablet Take 10 mg by mouth at bedtime. 05/23/19   [provider]  Semaglutide,0.25 or 0.5MG /DOS, (OZEMPIC, 0.25 OR 0.5 MG/DOSE,) 2 MG/1.5ML SOPN Inject 0.25 mg into the skin as directed. 10/25/18   [provider]  Tiotropium Bromide Monohydrate 2.5 MCG/ACT AERS Inhale 2 puffs into the lungs daily.    [provider]  tiZANidine (ZANAFLEX) 2 MG tablet Take 2 mg by mouth as directed. 02/02/17   [provider]  topiramate (TOPAMAX) 25 MG capsule Take 25 mg by mouth in the morning and at bedtime.    [provider]  traZODone (DESYREL) 100 MG tablet Take 100 mg by mouth at bedtime.    [provider]  triamcinolone cream (KENALOG) 0.1 % Apply 1 application topically in the morning and at bedtime. 06/11/19   [provider]  valACYclovir (VALTREX) 500 MG tablet Take 1 tablet (500 mg total) by mouth daily. Can increase to twice a day for 5 days in the event of a recurrence 10/27/20   Chancy Milroy, MD      Allergies    Patient has no known allergies.    Review of Systems   Review of Systems  Constitutional:  Negative for fever.  HENT:  Negative for congestion, ear pain, rhinorrhea and sore throat.   Eyes:  Negative for visual disturbance.  Respiratory:  Negative for cough and shortness of breath.   Cardiovascular:  Negative for chest pain.  Gastrointestinal:  Negative for abdominal pain, diarrhea, nausea and vomiting.  Genitourinary:  Negative for dysuria and hematuria.  Musculoskeletal:        Finger pain  Skin:  Negative for rash.  Neurological:  Positive for dizziness. Negative for weakness, light-headedness, numbness and headaches.  All other systems reviewed and are negative.  Physical Exam Updated Vital Signs BP (!) 166/91 (BP Location: Right Arm)    Pulse 73    Temp 98.9 F (37.2 C) (Oral)    Resp 16    SpO2 100%  Physical Exam Vitals and nursing note reviewed.  Constitutional:      General: She is not in acute distress.    Appearance: She is well-developed.  HENT:     Head: Normocephalic and atraumatic.  Eyes:     Conjunctiva/sclera: Conjunctivae normal.     Comments: No nystagmus  Cardiovascular:     Rate and Rhythm: Normal rate and regular rhythm.  Pulmonary:     Effort: Pulmonary effort is normal.     Breath sounds: Normal breath sounds.   Abdominal:     General: Bowel sounds are normal.     Palpations: Abdomen is soft.     Tenderness: There is no abdominal tenderness.  Musculoskeletal:        General: No swelling.     Cervical back: Neck supple.  Skin:    General: Skin is warm and dry.     Capillary Refill: Capillary refill takes less than 2 seconds.  Neurological:     Mental Status: She is alert.     Comments: Mental Status:  Alert, thought content appropriate, able to give a coherent history. Speech fluent without evidence of aphasia. Able to follow 2 step commands without difficulty.  Cranial Nerves:  II: pupils equal, round, reactive to light III,IV, VI: ptosis not present, extra-ocular motions intact bilaterally  V,VII: smile symmetric, facial light touch sensation equal VIII: hearing grossly normal to voice  X: uvula elevates symmetrically  XI: bilateral shoulder shrug symmetric and strong XII: midline tongue extension without fassiculations Motor:  Normal tone. 5/5 strength of BUE and BLE major muscle groups including strong and equal grip strength and dorsiflexion/plantar flexion Sensory: light touch normal in all extremities.   Psychiatric:        Mood and Affect: Mood normal.    ED Results / Procedures / Treatments   Labs (all labs ordered are listed, but only abnormal results are displayed) Labs Reviewed  CBC WITH DIFFERENTIAL/PLATELET - Abnormal; Notable for the following components:      Result Value   RBC 5.52 (*)    All other components within normal limits  BASIC METABOLIC PANEL - Abnormal; Notable for the following components:   Glucose, Bld 423 (*)    All other components within normal limits  URINALYSIS, ROUTINE W REFLEX MICROSCOPIC - Abnormal; Notable for the following components:   Glucose, UA >=500 (*)    Hgb urine dipstick SMALL (*)    All other components within normal limits  CBG MONITORING, ED - Abnormal; Notable for the following components:   Glucose-Capillary 293 (*)    All  other components within normal limits  I-STAT BETA HCG BLOOD, ED ([facility], [facility], [facility] ONLY)    EKG EKG Interpretation  Date/Time:  Saturday May 14 2021 16:24:37 EST Ventricular Rate:  88 PR Interval:  178 QRS Duration: 107 QT Interval:  408 QTC Calculation: 494 R Axis:   54 Text Interpretation: Sinus rhythm LAE, consider biatrial enlargement Borderline prolonged QT interval since last tracing no significant change Confirmed by Daleen Bo [phone]) on 05/14/2021 10:31:18 PM  Radiology CT Head Wo Contrast  Result Date: 05/15/2021 CLINICAL DATA:  Dizziness EXAM: CT HEAD WITHOUT CONTRAST TECHNIQUE: Contiguous axial images were obtained from the base of the skull through the vertex without intravenous contrast. COMPARISON:  None. FINDINGS: Brain: Normal anatomic configuration. No abnormal intra or extra-axial mass lesion or fluid collection. No abnormal mass effect or midline shift. No evidence of acute intracranial hemorrhage or infarct. Ventricular size is normal. Cerebellum unremarkable. Vascular: Unremarkable Skull: Intact Sinuses/Orbits: Paranasal sinuses are clear. Orbits are unremarkable. Other: Mastoid air cells and middle ear cavities are clear. IMPRESSION: No acute intracranial abnormality.  Normal examination. Electronically Signed   By: Fidela Salisbury M.D.   On: 05/15/2021 02:11   DG Hand Complete Right  Result Date: 05/14/2021 CLINICAL DATA:  Pain in the middle finger and thumb after injury. EXAM: RIGHT HAND - COMPLETE 3+ VIEW COMPARISON:  None. FINDINGS: There is questionable nondisplaced fracture at the base of the first distal phalanx. Joint spaces are well maintained. Soft tissues are within normal limits. IMPRESSION: 1. Questionable nondisplaced fracture at the base of the first distal phalanx. Correlate for point tenderness. Electronically Signed   By: Ronney Asters M.D.   On: 05/14/2021 16:58    Procedures Procedures   1:54 AM Cardiac monitoring reveals NSR, HR 80s (Rate &  rhythm), as reviewed and interpreted by me. Cardiac monitoring was ordered due to dizziness and to monitor patient for dysrhythmia.   Medications Ordered in ED Medications  insulin aspart (novoLOG) injection 6 Units (6 Units Subcutaneous Given 05/15/21 0230)  meclizine (ANTIVERT) tablet 25 mg (25 mg Oral Given 05/15/21 0229)  acetaminophen (TYLENOL) tablet 650 mg (650 mg Oral Given 05/15/21 0229)  sodium chloride 0.9 % bolus 1,000 mL (1,000 mLs Intravenous New Bag/Given (  Non-Interop) 05/15/21 0229)    ED Course/ Medical Decision Making/ A&P                           Medical Decision Making  51 year old female with intermittent dizziness ongoing for a week.  Seems positional in nature and resolves after minutes.  Similar symptoms were months ago but resolved spontaneously.  Also complaining of finger pain.  X-ray shows possible fracture however this is not the area where the patient has tenderness so I doubt fracture.  New erythema warmth or swelling so doubt gouty arthritis, doubt septic arthritis.  No evidence of other infectious process.  With regard to her dizziness this sounds peripheral in nature.  I did get a CT scan of her head given her complicated medical history and that she is a high risk patient.  This did not show any evidence of acute intracranial abnormality.  Her neurologic exam was reassuring on my evaluation.  She is ambulatory without ataxia.  She was given meclizine here in the ED and symptoms did improve.  She did have an elevated blood sugar on her labs so this may be contributing to her symptoms somewhat.  She is given fluids and insulin and her blood sugar improved.  She is advised on compliance with medications and close follow-up with PCP for titration of these.  Feel she is appropriate for discharge home with close follow-up and strict return precautions.  She voices understanding of plan and reasons to return.  All questions answered.  Patient stable for discharge.  Final  Clinical Impression(s) / ED Diagnoses Final diagnoses:  Dizziness  Pain of finger of right hand    Rx / DC Orders ED Discharge Orders          Ordered    meclizine (ANTIVERT) 25 MG tablet  3 times daily PRN        05/15/21 0352              Rodney Booze, PA-C 05/15/21 0358    Ripley Fraise, MD 05/15/21 [phone]

## 2021-05-15 NOTE — Discharge Instructions (Addendum)
Prescription given for Meclizine. Take medication as directed and do not operate machinery, drive a car, or work while taking this medication as it can make you drowsy.   Please follow up with your primary care provider within 3-5 days for re-evaluation of your symptoms. If you do not have a primary care provider, information for a healthcare clinic has been provided for you to make arrangements for follow up care. Please return to the emergency department for any new or worsening symptoms.

## 2021-10-11 NOTE — Progress Notes (Shared)
[facility] - Clinic Note  10/14/2021     CHIEF COMPLAINT Patient presents for No chief complaint on file.   HISTORY OF PRESENT ILLNESS: Lynn Bates is a 51 y.o. female who presents to the clinic today for:    pt states she is still using latanoprost at night, but has not used it in a couple days  Referring physician: Alma Lynn Lundquist, PA-C [address] [place],  [place] [id number]  HISTORICAL INFORMATION:   Selected notes from the MEDICAL RECORD NUMBER Referred by Lynn DownsAlbert Lundquist, PA-C for concern of pigmented holes OD LEE:08.05.20 (A. Lynn Bates) [BCVA: 20/20 OU]  Ocular Hx-glaucoma suspect, DES, NS PMH-DM, HTN, blood clots   CURRENT MEDICATIONS: Current Outpatient Medications (Ophthalmic Drugs)  Medication Sig   latanoprost (XALATAN) 0.005 % ophthalmic solution Place 1 drop into both eyes at bedtime.   No current facility-administered medications for this visit. (Ophthalmic Drugs)   Current Outpatient Medications (Other)  Medication Sig   albuterol (VENTOLIN HFA) 108 (90 Base) MCG/ACT inhaler Inhale 1 puff into the lungs as directed.   amLODipine (NORVASC) 10 MG tablet Take 10 mg by mouth at bedtime.   apixaban (ELIQUIS) 5 MG TABS tablet Take 5 mg by mouth in the morning and at bedtime.   budesonide-formoterol (SYMBICORT) 160-4.5 MCG/ACT inhaler Inhale 1 puff into the lungs in the morning and at bedtime.   emtricitabine-tenofovir (TRUVADA) 200-300 MG tablet Take by mouth as directed.   famotidine (PEPCID) 20 MG tablet Take 20 mg by mouth in the morning and at bedtime.   furosemide (LASIX) 20 MG tablet Take 20 mg by mouth in the morning and at bedtime.   linaclotide (LINZESS) 145 MCG CAPS capsule Take 145 mcg by mouth daily.   meclizine (ANTIVERT) 25 MG tablet Take 1 tablet (25 mg total) by mouth 3 (three) times daily as needed for dizziness.   methocarbamol (ROBAXIN) 750 MG tablet Take 750 mg by mouth in the morning, at noon, and at bedtime.    metoprolol tartrate (LOPRESSOR) 50 MG tablet Take 50 mg by mouth in the morning and at bedtime.   montelukast (SINGULAIR) 10 MG tablet Take 10 mg by mouth as directed.   pantoprazole (PROTONIX) 40 MG tablet Take 40 mg by mouth daily.   pravastatin (PRAVACHOL) 20 MG tablet Take 20 mg by mouth daily.   predniSONE (DELTASONE) 10 MG tablet Take 10 mg by mouth as directed.   raltegravir (ISENTRESS) 400 MG tablet Take 400 mg by mouth in the morning and at bedtime.   ranitidine (ZANTAC) 300 MG tablet Take 300 mg by mouth as directed.   risperiDONE (RISPERDAL) 2 MG tablet Take 2 mg by mouth daily.   rosuvastatin (CRESTOR) 10 MG tablet Take 10 mg by mouth at bedtime.   Semaglutide,0.25 or 0.5MG /DOS, (OZEMPIC, 0.25 OR 0.5 MG/DOSE,) 2 MG/1.5ML SOPN Inject 0.25 mg into the skin as directed.   Tiotropium Bromide Monohydrate 2.5 MCG/ACT AERS Inhale 2 puffs into the lungs daily.   tiZANidine (ZANAFLEX) 2 MG tablet Take 2 mg by mouth as directed.   topiramate (TOPAMAX) 25 MG capsule Take 25 mg by mouth in the morning and at bedtime.   traZODone (DESYREL) 100 MG tablet Take 100 mg by mouth at bedtime.   triamcinolone cream (KENALOG) 0.1 % Apply 1 application topically in the morning and at bedtime.   valACYclovir (VALTREX) 500 MG tablet Take 1 tablet (500 mg total) by mouth daily. Can increase to twice a day for 5  days in the event of a recurrence   No current facility-administered medications for this visit. (Other)      REVIEW OF SYSTEMS:     ALLERGIES No Known Allergies  PAST MEDICAL HISTORY Past Medical History:  Diagnosis Date   Hypertension    Past Surgical History:  Procedure Laterality Date   CESAREAN SECTION     PACEMAKER IMPLANT     2009    FAMILY HISTORY Family History  Problem Relation Age of Onset   Glaucoma Father    Glaucoma Sister    Diabetes Other    Diabetes Maternal Aunt     SOCIAL HISTORY Social History   Tobacco Use   Smoking status: Every Day   Smokeless  tobacco: Current  Vaping Use   Vaping Use: Never used  Substance Use Topics   Alcohol use: Not Currently   Drug use: Never         OPHTHALMIC EXAM:   Not recorded     IMAGING AND PROCEDURES  Imaging and Procedures for @TODAY @            ASSESSMENT/PLAN:    ICD-10-CM   1. Right retinal defect  H33.301     2. Diabetes mellitus type 2 without retinopathy (HCC)  E11.9     3. Essential hypertension  I10     4. Hypertensive retinopathy of both eyes  H35.033     5. Combined forms of age-related cataract of both eyes  H25.813     6. HIV disease (HCC)  B20     7. 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 Retinal defect OD  - focal pigmented retinal defects/CR scars located at 0600, equator  - asymptomatic  - no surrounding SRF   - s/p laser retinopexy OD (09.18.20) -- good laser changes surrounding lesions  - f/u 1 year, sooner prn  2. Diabetes mellitus, type 2 without retinopathy  - The incidence, risk factors for progression, natural history and treatment options for diabetic retinopathy  were discussed with patient.    - The need for close monitoring of blood glucose, blood pressure, and serum lipids, avoiding cigarette or any type of tobacco, and the need for long term follow up was also discussed with patient.   - f/u in 1 year, sooner prn   3,4. Hypertensive retinopathy OU  - discussed importance of tight BP control  - monitor   5. Mixed form age related cataracts OU  - mild  - The symptoms of cataract, surgical options, and treatments and risks were discussed with patient.  - discussed diagnosis and progression  - not yet visually significant  - monitor for now   6. HIV+ without retinopathy  - takes Truvada and Isentress  7. Ocular hypertension OU  - IOP 26 OD and 25 OS  - pt reports she has not used Latanoprost for several days  - discussed importance of compliance with drops for vision and ocular health  - continue latanoprost qhs  OU   Ophthalmic Meds Ordered this visit:  No orders of the defined types were placed in this encounter.      No follow-ups on file.  There are no Patient Instructions on file for this visit.  This document serves as a record of services personally performed by 09.20.20, MD, PhD. It was created on their behalf by Karie Chimera, an ophthalmic technician. The creation of this record is the provider's dictation and/or activities during the visit.  Electronically signed by: Joni Reining COA, 10/11/21  12:06 PM   Karie Chimera, M.D., Ph.D. Diseases & Surgery of the Retina and Vitreous [facility]    Abbreviations: M myopia (nearsighted); A astigmatism; H hyperopia (farsighted); P presbyopia; Mrx spectacle prescription;  CTL contact lenses; OD right eye; OS left eye; OU both eyes  XT exotropia; ET esotropia; PEK punctate epithelial keratitis; PEE punctate epithelial erosions; DES dry eye syndrome; MGD meibomian gland dysfunction; ATs artificial tears; PFAT's preservative free artificial tears; NSC nuclear sclerotic cataract; PSC posterior subcapsular cataract; ERM epi-retinal membrane; PVD posterior vitreous detachment; RD retinal detachment; DM diabetes mellitus; DR diabetic retinopathy; NPDR non-proliferative diabetic retinopathy; PDR proliferative diabetic retinopathy; CSME clinically significant macular edema; DME diabetic macular edema; dbh dot blot hemorrhages; CWS cotton wool spot; POAG primary open angle glaucoma; C/D cup-to-disc ratio; HVF humphrey visual field; GVF goldmann visual field; OCT optical coherence tomography; IOP intraocular pressure; BRVO Branch retinal vein occlusion; CRVO central retinal vein occlusion; CRAO central retinal artery occlusion; BRAO branch retinal artery occlusion; RT retinal tear; SB scleral buckle; PPV pars plana vitrectomy; VH Vitreous hemorrhage; PRP panretinal laser photocoagulation; IVK intravitreal kenalog; VMT  vitreomacular traction; MH Macular hole;  NVD neovascularization of the disc; NVE neovascularization elsewhere; AREDS age related eye disease study; ARMD age related macular degeneration; POAG primary open angle glaucoma; EBMD epithelial/anterior basement membrane dystrophy; ACIOL anterior chamber intraocular lens; IOL intraocular lens; PCIOL posterior chamber intraocular lens; Phaco/IOL phacoemulsification with intraocular lens placement; PRK photorefractive keratectomy; LASIK laser assisted in situ keratomileusis; HTN hypertension; DM diabetes mellitus; COPD chronic obstructive pulmonary disease

## 2021-10-14 ENCOUNTER — Encounter (INDEPENDENT_AMBULATORY_CARE_PROVIDER_SITE_OTHER): Payer: Medicaid Other | Admitting: Ophthalmology

## 2021-10-14 DIAGNOSIS — E119 Type 2 diabetes mellitus without complications: Secondary | ICD-10-CM

## 2021-10-14 DIAGNOSIS — B20 Human immunodeficiency virus [HIV] disease: Secondary | ICD-10-CM

## 2021-10-14 DIAGNOSIS — H33301 Unspecified retinal break, right eye: Secondary | ICD-10-CM

## 2021-10-14 DIAGNOSIS — H40053 Ocular hypertension, bilateral: Secondary | ICD-10-CM

## 2021-10-14 DIAGNOSIS — H25813 Combined forms of age-related cataract, bilateral: Secondary | ICD-10-CM

## 2021-10-14 DIAGNOSIS — I10 Essential (primary) hypertension: Secondary | ICD-10-CM

## 2021-10-14 DIAGNOSIS — H35033 Hypertensive retinopathy, bilateral: Secondary | ICD-10-CM

## 2021-11-08 ENCOUNTER — Encounter (HOSPITAL_COMMUNITY): Payer: Self-pay

## 2021-11-08 ENCOUNTER — Emergency Department (HOSPITAL_COMMUNITY): Payer: Medicaid Other

## 2021-11-08 ENCOUNTER — Inpatient Hospital Stay (HOSPITAL_COMMUNITY)
Admission: EM | Admit: 2021-11-08 | Discharge: 2021-11-11 | DRG: 638 | Disposition: A | Discharge status: Home / Self Care | Payer: Medicaid Other | Attending: Student | Admitting: Student

## 2021-11-08 DIAGNOSIS — I2699 Other pulmonary embolism without acute cor pulmonale: Secondary | ICD-10-CM | POA: Diagnosis present

## 2021-11-08 DIAGNOSIS — I82533 Chronic embolism and thrombosis of popliteal vein, bilateral: Secondary | ICD-10-CM | POA: Diagnosis present

## 2021-11-08 DIAGNOSIS — R778 Other specified abnormalities of plasma proteins: Secondary | ICD-10-CM | POA: Diagnosis present

## 2021-11-08 DIAGNOSIS — Z6841 Body Mass Index (BMI) 40.0 and over, adult: Secondary | ICD-10-CM

## 2021-11-08 DIAGNOSIS — Z7951 Long term (current) use of inhaled steroids: Secondary | ICD-10-CM

## 2021-11-08 DIAGNOSIS — F339 Major depressive disorder, recurrent, unspecified: Secondary | ICD-10-CM | POA: Diagnosis present

## 2021-11-08 DIAGNOSIS — B20 Human immunodeficiency virus [HIV] disease: Secondary | ICD-10-CM | POA: Diagnosis present

## 2021-11-08 DIAGNOSIS — Z720 Tobacco use: Secondary | ICD-10-CM

## 2021-11-08 DIAGNOSIS — Z86711 Personal history of pulmonary embolism: Secondary | ICD-10-CM

## 2021-11-08 DIAGNOSIS — E1169 Type 2 diabetes mellitus with other specified complication: Secondary | ICD-10-CM | POA: Diagnosis present

## 2021-11-08 DIAGNOSIS — E119 Type 2 diabetes mellitus without complications: Secondary | ICD-10-CM

## 2021-11-08 DIAGNOSIS — K219 Gastro-esophageal reflux disease without esophagitis: Secondary | ICD-10-CM | POA: Diagnosis present

## 2021-11-08 DIAGNOSIS — M7989 Other specified soft tissue disorders: Principal | ICD-10-CM

## 2021-11-08 DIAGNOSIS — E1165 Type 2 diabetes mellitus with hyperglycemia: Secondary | ICD-10-CM

## 2021-11-08 DIAGNOSIS — Z22322 Carrier or suspected carrier of Methicillin resistant Staphylococcus aureus: Secondary | ICD-10-CM

## 2021-11-08 DIAGNOSIS — I82531 Chronic embolism and thrombosis of right popliteal vein: Secondary | ICD-10-CM | POA: Diagnosis present

## 2021-11-08 DIAGNOSIS — G4730 Sleep apnea, unspecified: Secondary | ICD-10-CM | POA: Diagnosis present

## 2021-11-08 DIAGNOSIS — E11628 Type 2 diabetes mellitus with other skin complications: Principal | ICD-10-CM | POA: Diagnosis present

## 2021-11-08 DIAGNOSIS — Z79891 Long term (current) use of opiate analgesic: Secondary | ICD-10-CM

## 2021-11-08 DIAGNOSIS — L02416 Cutaneous abscess of left lower limb: Secondary | ICD-10-CM | POA: Diagnosis present

## 2021-11-08 DIAGNOSIS — Z7901 Long term (current) use of anticoagulants: Secondary | ICD-10-CM

## 2021-11-08 DIAGNOSIS — L0390 Cellulitis, unspecified: Secondary | ICD-10-CM | POA: Diagnosis present

## 2021-11-08 DIAGNOSIS — Z833 Family history of diabetes mellitus: Secondary | ICD-10-CM

## 2021-11-08 DIAGNOSIS — R7989 Other specified abnormal findings of blood chemistry: Secondary | ICD-10-CM

## 2021-11-08 DIAGNOSIS — L03116 Cellulitis of left lower limb: Secondary | ICD-10-CM

## 2021-11-08 DIAGNOSIS — I10 Essential (primary) hypertension: Secondary | ICD-10-CM | POA: Diagnosis present

## 2021-11-08 DIAGNOSIS — Z950 Presence of cardiac pacemaker: Secondary | ICD-10-CM

## 2021-11-08 DIAGNOSIS — Z7985 Long-term (current) use of injectable non-insulin antidiabetic drugs: Secondary | ICD-10-CM

## 2021-11-08 DIAGNOSIS — Z5901 Sheltered homelessness: Secondary | ICD-10-CM

## 2021-11-08 DIAGNOSIS — I421 Obstructive hypertrophic cardiomyopathy: Secondary | ICD-10-CM | POA: Diagnosis present

## 2021-11-08 DIAGNOSIS — Z79899 Other long term (current) drug therapy: Secondary | ICD-10-CM

## 2021-11-08 DIAGNOSIS — Z95828 Presence of other vascular implants and grafts: Secondary | ICD-10-CM

## 2021-11-08 DIAGNOSIS — Z91148 Patient's other noncompliance with medication regimen for other reason: Secondary | ICD-10-CM

## 2021-11-08 DIAGNOSIS — Z91199 Patient's noncompliance with other medical treatment and regimen due to unspecified reason: Secondary | ICD-10-CM

## 2021-11-08 DIAGNOSIS — I110 Hypertensive heart disease with heart failure: Secondary | ICD-10-CM | POA: Diagnosis present

## 2021-11-08 DIAGNOSIS — E785 Hyperlipidemia, unspecified: Secondary | ICD-10-CM | POA: Diagnosis present

## 2021-11-08 DIAGNOSIS — I5032 Chronic diastolic (congestive) heart failure: Secondary | ICD-10-CM | POA: Diagnosis present

## 2021-11-08 LAB — BASIC METABOLIC PANEL
Anion gap: 9 (ref 5–15)
BUN: 9 mg/dL (ref 6–20)
CO2: 24 mmol/L (ref 22–32)
Calcium: 9.4 mg/dL (ref 8.9–10.3)
Chloride: 105 mmol/L (ref 98–111)
Creatinine, Ser: 0.78 mg/dL (ref 0.44–1.00)
GFR, Estimated: >60 mL/min (ref >60)
Glucose, Bld: 354 mg/dL — ABNORMAL HIGH (ref 70–99)
Potassium: 4 mmol/L (ref 3.5–5.1)
Sodium: 138 mmol/L (ref 135–145)

## 2021-11-08 LAB — CBC
HCT: 44.7 % (ref 36.0–46.0)
Hemoglobin: 14.4 g/dL (ref 12.0–15.0)
MCH: 27.9 pg (ref 26.0–34.0)
MCHC: 32.2 g/dL (ref 30.0–36.0)
MCV: 86.5 fL (ref 80.0–100.0)
Platelets: 326 10*3/uL (ref 150–400)
RBC: 5.17 MIL/uL — ABNORMAL HIGH (ref 3.87–5.11)
RDW: 12.4 % (ref 11.5–15.5)
WBC: 6 10*3/uL (ref 4.0–10.5)
nRBC: 0 % (ref 0.0–0.2)

## 2021-11-08 LAB — BRAIN NATRIURETIC PEPTIDE: B Natriuretic Peptide: 95.5 pg/mL (ref 0.0–100.0)

## 2021-11-08 LAB — TROPONIN I (HIGH SENSITIVITY)
Troponin I (High Sensitivity): 93 ng/L — ABNORMAL HIGH (ref <18)
Troponin I (High Sensitivity): 108 ng/L (ref <18)

## 2021-11-08 MED ORDER — HEPARIN (PORCINE) 25000 UT/250ML-% IV SOLN
1650.0000 [IU]/h | INTRAVENOUS | Status: AC
  Administered 2021-11-09 (×2): 1650 [IU]/h via INTRAVENOUS
  Filled 2021-11-08 (×3): qty 250

## 2021-11-08 MED ORDER — CEFAZOLIN SODIUM-DEXTROSE 1-4 GM/50ML-% IV SOLN
1.0000 g | Freq: Once | INTRAVENOUS | Status: DC
  Filled 2021-11-08: qty 50

## 2021-11-08 MED ORDER — IOHEXOL 350 MG/ML SOLN
100.0000 mL | Freq: Once | INTRAVENOUS | Status: AC | PRN
  Administered 2021-11-08: 100 mL via INTRAVENOUS

## 2021-11-08 MED ORDER — HEPARIN BOLUS VIA INFUSION
3000.0000 [IU] | Freq: Once | INTRAVENOUS | Status: AC
  Administered 2021-11-09: 3000 [IU] via INTRAVENOUS
  Filled 2021-11-08: qty 3000

## 2021-11-08 NOTE — ED Provider Triage Note (Signed)
Emergency Medicine Provider Triage Evaluation Note  Lynn Bates , a 51 y.o. female  was evaluated in triage.  Pt complains of BL leg swelling and discomfort R>L hx of DVT and on eliquis. Hx of CHF and on lasix.   Weeping of ulcer on left leg for the past 1 week.   No CP  Some SOB. Worse with laying down.   Review of Systems  Positive: SOB, leg swelling.  Negative: Fever   Physical Exam  BP (!) 153/92 (BP Location: Left Arm)   Pulse 100   Temp 97.8 F (36.6 C) (Oral)   Resp 18   SpO2 95%  Gen:   Awake, no distress   Resp:  Normal effort  MSK:   Moves extremities without difficulty  Other:  No crackles  Medical Decision Making  Medically screening exam initiated at 6:21 PM.  Appropriate orders placed.  Klohe Lovering was informed that the remainder of the evaluation will be completed by another provider, this initial triage assessment does not replace that evaluation, and the importance of remaining in the ED until their evaluation is complete.  BNP, labs, ekg CXR   Gailen Shelter, [place] 11/08/21 1824

## 2021-11-08 NOTE — Progress Notes (Addendum)
ANTICOAGULATION CONSULT NOTE - Initial Consult  Pharmacy Consult for heparin  Indication: pulmonary embolus  No Known Allergies  Patient Measurements: Height: 5\' 5"  (165.1 cm) Weight: 127.9 kg (282 lb) IBW/kg (Calculated) : 57 Heparin Dosing Weight: 88.2 kg  Vital Signs: Temp: 97.8 F (36.6 C) (06/27 1818) Temp Source: Oral (06/27 1818) BP: 169/105 (06/27 2200) Pulse Rate: 86 (06/27 2200)  Labs: Recent Labs    11/08/21 1917 11/08/21 2045  HGB 14.4  --   HCT 44.7  --   PLT 326  --   CREATININE 0.78  --   TROPONINIHS 93* 108*    Estimated Creatinine Clearance: 113.4 mL/min (by C-G formula based on SCr of 0.78 mg/dL).   Medical History: Past Medical History:  Diagnosis Date   Hypertension    Assessment: 51 yo F on apixaban PTA for hx PE/DVT to start heparin per pharmacy for r/o PE.  Per RN interview her last dose of apixaban was yesterday 6/27 (but last filled 08/22/21 per dispense history) CBC WNL, SCr WNL  Goal of Therapy:  Heparin level 0.3-0.7 units/ml aPTT 66-102 seconds Monitor platelets by anticoagulation protocol: Yes   Plan:  Draw baseline aPTT & heparin level now to determine if apixaban still in sytem Heparin 3000 unit IV bolus x 1 Heparin drip at 1650 units/hr Check 6 hour aPTT & heparin level  Daily CBC, heparin level & aPTT  10/22/21, Pharm.D 11/08/2021 11:20 PM  Addendum: Baseline heparin level < 0.1 & aPTT 26 seconds> no apixaban in system>> will use heparin level for heparin monitoring.   11/10/2021, Pharm.D 11/09/2021 3:55 AM

## 2021-11-09 ENCOUNTER — Emergency Department (HOSPITAL_COMMUNITY): Payer: Medicaid Other

## 2021-11-09 DIAGNOSIS — Z95828 Presence of other vascular implants and grafts: Secondary | ICD-10-CM | POA: Diagnosis not present

## 2021-11-09 DIAGNOSIS — L0390 Cellulitis, unspecified: Secondary | ICD-10-CM | POA: Diagnosis present

## 2021-11-09 DIAGNOSIS — Z79899 Other long term (current) drug therapy: Secondary | ICD-10-CM | POA: Diagnosis not present

## 2021-11-09 DIAGNOSIS — Z79891 Long term (current) use of opiate analgesic: Secondary | ICD-10-CM | POA: Diagnosis not present

## 2021-11-09 DIAGNOSIS — I82533 Chronic embolism and thrombosis of popliteal vein, bilateral: Secondary | ICD-10-CM | POA: Diagnosis present

## 2021-11-09 DIAGNOSIS — I82531 Chronic embolism and thrombosis of right popliteal vein: Secondary | ICD-10-CM | POA: Diagnosis not present

## 2021-11-09 DIAGNOSIS — M7989 Other specified soft tissue disorders: Secondary | ICD-10-CM | POA: Diagnosis not present

## 2021-11-09 DIAGNOSIS — G4730 Sleep apnea, unspecified: Secondary | ICD-10-CM | POA: Diagnosis present

## 2021-11-09 DIAGNOSIS — I110 Hypertensive heart disease with heart failure: Secondary | ICD-10-CM | POA: Diagnosis present

## 2021-11-09 DIAGNOSIS — E1169 Type 2 diabetes mellitus with other specified complication: Secondary | ICD-10-CM | POA: Diagnosis present

## 2021-11-09 DIAGNOSIS — L03116 Cellulitis of left lower limb: Secondary | ICD-10-CM | POA: Diagnosis present

## 2021-11-09 DIAGNOSIS — L02416 Cutaneous abscess of left lower limb: Secondary | ICD-10-CM | POA: Diagnosis present

## 2021-11-09 DIAGNOSIS — B20 Human immunodeficiency virus [HIV] disease: Secondary | ICD-10-CM | POA: Diagnosis present

## 2021-11-09 DIAGNOSIS — K219 Gastro-esophageal reflux disease without esophagitis: Secondary | ICD-10-CM | POA: Diagnosis present

## 2021-11-09 DIAGNOSIS — R778 Other specified abnormalities of plasma proteins: Secondary | ICD-10-CM | POA: Diagnosis present

## 2021-11-09 DIAGNOSIS — Z7985 Long-term (current) use of injectable non-insulin antidiabetic drugs: Secondary | ICD-10-CM | POA: Diagnosis not present

## 2021-11-09 DIAGNOSIS — E1165 Type 2 diabetes mellitus with hyperglycemia: Secondary | ICD-10-CM | POA: Diagnosis present

## 2021-11-09 DIAGNOSIS — Z7901 Long term (current) use of anticoagulants: Secondary | ICD-10-CM | POA: Diagnosis not present

## 2021-11-09 DIAGNOSIS — Z7951 Long term (current) use of inhaled steroids: Secondary | ICD-10-CM | POA: Diagnosis not present

## 2021-11-09 DIAGNOSIS — I10 Essential (primary) hypertension: Secondary | ICD-10-CM | POA: Diagnosis not present

## 2021-11-09 DIAGNOSIS — E785 Hyperlipidemia, unspecified: Secondary | ICD-10-CM | POA: Diagnosis present

## 2021-11-09 DIAGNOSIS — F339 Major depressive disorder, recurrent, unspecified: Secondary | ICD-10-CM | POA: Diagnosis present

## 2021-11-09 DIAGNOSIS — I5032 Chronic diastolic (congestive) heart failure: Secondary | ICD-10-CM | POA: Diagnosis present

## 2021-11-09 DIAGNOSIS — I421 Obstructive hypertrophic cardiomyopathy: Secondary | ICD-10-CM | POA: Diagnosis present

## 2021-11-09 DIAGNOSIS — Z5901 Sheltered homelessness: Secondary | ICD-10-CM | POA: Diagnosis not present

## 2021-11-09 DIAGNOSIS — Z6841 Body Mass Index (BMI) 40.0 and over, adult: Secondary | ICD-10-CM | POA: Diagnosis not present

## 2021-11-09 DIAGNOSIS — E11628 Type 2 diabetes mellitus with other skin complications: Secondary | ICD-10-CM | POA: Diagnosis present

## 2021-11-09 LAB — T-HELPER CELLS (CD4) COUNT (NOT AT ARMC)
CD4 % Helper T Cell: 20 % — ABNORMAL LOW (ref 33–65)
CD4 T Cell Abs: 579 /uL (ref 400–1790)

## 2021-11-09 LAB — HEPARIN LEVEL (UNFRACTIONATED)
Heparin Unfractionated: <0.1 IU/mL — ABNORMAL LOW (ref 0.30–0.70)
Heparin Unfractionated: 0.52 IU/mL (ref 0.30–0.70)
Heparin Unfractionated: 0.52 IU/mL (ref 0.30–0.70)

## 2021-11-09 LAB — BRAIN NATRIURETIC PEPTIDE: B Natriuretic Peptide: 66.3 pg/mL (ref 0.0–100.0)

## 2021-11-09 LAB — LACTIC ACID, PLASMA
Lactic Acid, Venous: 1.3 mmol/L (ref 0.5–1.9)
Lactic Acid, Venous: 1.3 mmol/L (ref 0.5–1.9)

## 2021-11-09 LAB — CBC
HCT: 42.1 % (ref 36.0–46.0)
Hemoglobin: 13.5 g/dL (ref 12.0–15.0)
MCH: 27.8 pg (ref 26.0–34.0)
MCHC: 32.1 g/dL (ref 30.0–36.0)
MCV: 86.8 fL (ref 80.0–100.0)
Platelets: 288 10*3/uL (ref 150–400)
RBC: 4.85 MIL/uL (ref 3.87–5.11)
RDW: 12.6 % (ref 11.5–15.5)
WBC: 5.4 10*3/uL (ref 4.0–10.5)
nRBC: 0 % (ref 0.0–0.2)

## 2021-11-09 LAB — MRSA NEXT GEN BY PCR, NASAL: MRSA by PCR Next Gen: DETECTED — AB

## 2021-11-09 LAB — APTT: aPTT: 26 seconds (ref 24–36)

## 2021-11-09 MED ORDER — EMTRICITABINE-TENOFOVIR AF 200-25 MG PO TABS
1.0000 | ORAL_TABLET | Freq: Every day | ORAL | Status: DC
Start: 2021-11-09 — End: 2021-11-11
  Administered 2021-11-09 – 2021-11-11 (×3): 1 via ORAL
  Filled 2021-11-09 (×3): qty 1

## 2021-11-09 MED ORDER — VANCOMYCIN HCL 2000 MG/400ML IV SOLN
2000.0000 mg | Freq: Once | INTRAVENOUS | Status: AC
  Administered 2021-11-09: 2000 mg via INTRAVENOUS
  Filled 2021-11-09: qty 400

## 2021-11-09 MED ORDER — PANTOPRAZOLE SODIUM 40 MG PO TBEC
40.0000 mg | DELAYED_RELEASE_TABLET | Freq: Every day | ORAL | Status: DC
  Administered 2021-11-09 – 2021-11-11 (×3): 40 mg via ORAL
  Filled 2021-11-09 (×3): qty 1

## 2021-11-09 MED ORDER — TIOTROPIUM BROMIDE MONOHYDRATE 2.5 MCG/ACT IN AERS
2.0000 | INHALATION_SPRAY | Freq: Every day | RESPIRATORY_TRACT | Status: DC
Start: 2021-11-09 — End: 2021-11-09

## 2021-11-09 MED ORDER — RALTEGRAVIR POTASSIUM 400 MG PO TABS
400.0000 mg | ORAL_TABLET | Freq: Two times a day (BID) | ORAL | Status: DC
  Administered 2021-11-09 – 2021-11-11 (×5): 400 mg via ORAL
  Filled 2021-11-09 (×6): qty 1

## 2021-11-09 MED ORDER — PRAVASTATIN SODIUM 20 MG PO TABS
20.0000 mg | ORAL_TABLET | Freq: Every day | ORAL | Status: DC
  Administered 2021-11-09 – 2021-11-10 (×2): 20 mg via ORAL
  Filled 2021-11-09 (×2): qty 1

## 2021-11-09 MED ORDER — MONTELUKAST SODIUM 10 MG PO TABS
10.0000 mg | ORAL_TABLET | Freq: Every day | ORAL | Status: DC
  Administered 2021-11-09 – 2021-11-10 (×2): 10 mg via ORAL
  Filled 2021-11-09 (×2): qty 1

## 2021-11-09 MED ORDER — SODIUM CHLORIDE 0.9 % IV SOLN
15.0000 mg/kg | INTRAVENOUS | Status: DC
Start: 2021-11-09 — End: 2021-11-09

## 2021-11-09 MED ORDER — METOPROLOL TARTRATE 50 MG PO TABS
50.0000 mg | ORAL_TABLET | Freq: Two times a day (BID) | ORAL | Status: DC
  Administered 2021-11-09 – 2021-11-11 (×5): 50 mg via ORAL
  Filled 2021-11-09 (×2): qty 1
  Filled 2021-11-09: qty 2
  Filled 2021-11-09 (×2): qty 1

## 2021-11-09 MED ORDER — LOSARTAN POTASSIUM 50 MG PO TABS
25.0000 mg | ORAL_TABLET | Freq: Every day | ORAL | Status: DC
  Administered 2021-11-09 – 2021-11-11 (×3): 25 mg via ORAL
  Filled 2021-11-09 (×3): qty 1

## 2021-11-09 MED ORDER — CEFAZOLIN SODIUM-DEXTROSE 2-4 GM/100ML-% IV SOLN
2.0000 g | Freq: Three times a day (TID) | INTRAVENOUS | Status: DC
  Administered 2021-11-09: 2 g via INTRAVENOUS
  Filled 2021-11-09 (×3): qty 100

## 2021-11-09 MED ORDER — ACETAMINOPHEN 500 MG PO TABS
1000.0000 mg | ORAL_TABLET | Freq: Four times a day (QID) | ORAL | Status: DC | PRN
  Administered 2021-11-10: 1000 mg via ORAL
  Filled 2021-11-09: qty 2

## 2021-11-09 MED ORDER — ENOXAPARIN SODIUM 120 MG/0.8ML IJ SOSY
120.0000 mg | PREFILLED_SYRINGE | Freq: Two times a day (BID) | INTRAMUSCULAR | Status: DC
Start: 2021-11-09 — End: 2021-11-10
  Administered 2021-11-09 – 2021-11-10 (×2): 120 mg via SUBCUTANEOUS
  Filled 2021-11-09 (×2): qty 0.8

## 2021-11-09 MED ORDER — VANCOMYCIN HCL IN DEXTROSE 1-5 GM/200ML-% IV SOLN
1000.0000 mg | Freq: Two times a day (BID) | INTRAVENOUS | Status: DC
  Administered 2021-11-10 – 2021-11-11 (×2): 1000 mg via INTRAVENOUS
  Filled 2021-11-09 (×3): qty 200

## 2021-11-09 MED ORDER — RISPERIDONE 2 MG PO TABS
2.0000 mg | ORAL_TABLET | Freq: Every day | ORAL | Status: DC
  Administered 2021-11-09 – 2021-11-11 (×3): 2 mg via ORAL
  Filled 2021-11-09 (×3): qty 1

## 2021-11-09 NOTE — H&P (Addendum)
History and Physical    Patient: Lynn Bates TDD:[id number] DOB: [birth year] DOA: 11/08/2021 DOS: the patient was seen and examined on 11/09/2021 [facility]: [facility]  Patient coming from: Home  Chief Complaint:  Chief Complaint  Patient presents with   Leg Swelling    Bilateral    HPI: Lynn Bates is a 51 y.o. female with medical history significant of HOCM, pacemaker, CHF, HIV, prior IVC filter with multiple blood clots, asthma, hypertension, diabetes, homelessness here for progressively worsening left foot and ankle swelling.  Left foot and ankle swelling that began last week, was progressively worsening.  She also reports redness, and pain in the right foot and ankle.  She reports fevers and chills as well.  Reports she has not been taking her medications as well as she is supposed to including her dinner.  Denies any numbness, loss of sensation, motor weakness in her left leg.  Left foot as starting as well but went down.No chest pain, chest pressure, palpitations, syncope or presyncope, orthopnea, PND, SOB, abdominal pain, nausea, vomiting, diarrhea.  IVC filter (2003) and PM implantation ([facility], 2009)  Last time she took medication was Monday  Review of Systems: As mentioned in the history of present illness. All other systems reviewed and are negative. Past Medical History:  Diagnosis Date   Hypertension    Past Surgical History:  Procedure Laterality Date   CESAREAN SECTION     PACEMAKER IMPLANT     2009   Social History:  reports that she has been smoking. She uses smokeless tobacco. She reports that she does not currently use alcohol. She reports that she does not use drugs.  No Known Allergies  Family History  Problem Relation Age of Onset   Glaucoma Father    Glaucoma Sister    Diabetes Other    Diabetes Maternal Aunt     Prior to Admission medications   Medication Sig Start Date End Date Taking? Authorizing Provider  acetaminophen  (TYLENOL) 500 MG tablet Take 1,000 mg by mouth every 6 (six) hours as needed for mild pain.   Yes [provider]  amLODipine (NORVASC) 10 MG tablet Take 10 mg by mouth at bedtime. 06/16/19  Yes [provider]  apixaban (ELIQUIS) 5 MG TABS tablet Take 5 mg by mouth in the morning and at bedtime. 10/01/18  Yes [provider]  DESCOVY 200-25 MG tablet Take 1 tablet by mouth daily. 06/24/21  Yes [provider]  furosemide (LASIX) 20 MG tablet Take 20 mg by mouth in the morning and at bedtime. 06/16/19  Yes [provider]  losartan (COZAAR) 25 MG tablet Take 25 mg by mouth daily. 07/18/21  Yes [provider]  meclizine (ANTIVERT) 25 MG tablet Take 1 tablet (25 mg total) by mouth 3 (three) times daily as needed for dizziness. 05/15/21  Yes Couture, Cortni S, PA-C  metFORMIN (GLUCOPHAGE) 500 MG tablet Take 500 mg by mouth 2 (two) times daily. 07/18/21  Yes [provider]  metoprolol tartrate (LOPRESSOR) 50 MG tablet Take 50 mg by mouth in the morning and at bedtime. 10/07/15  Yes [provider]  montelukast (SINGULAIR) 10 MG tablet Take 10 mg by mouth as directed. 02/22/19  Yes [provider]  pantoprazole (PROTONIX) 40 MG tablet Take 40 mg by mouth daily. 04/25/15  Yes [provider]  raltegravir (ISENTRESS) 400 MG tablet Take 400 mg by mouth in the morning and at bedtime. 08/03/09  Yes [provider]  latanoprost (XALATAN) 0.005 % ophthalmic solution Place 1 drop into both eyes at bedtime. Patient not taking: Reported on 11/09/2021 02/02/17   [provider]  linaclotide Karlene Einstein) 145 MCG CAPS capsule Take 145 mcg by mouth daily. Patient not taking: Reported on 11/09/2021 06/05/17   [provider]  pravastatin (PRAVACHOL) 20 MG tablet Take 20 mg by mouth daily. Patient not taking: Reported on 11/09/2021 04/17/15   [provider]  Semaglutide, 1 MG/DOSE, 4 MG/3ML SOPN Inject 1 mg into  the skin once a week. Patient not taking: Reported on 11/09/2021 08/25/20   [provider]  Tiotropium Bromide Monohydrate 2.5 MCG/ACT AERS Inhale 2 puffs into the lungs daily. Patient not taking: Reported on 11/09/2021    [provider]  triamcinolone cream (KENALOG) 0.1 % Apply 1 application topically in the morning and at bedtime. Patient not taking: Reported on 11/09/2021 06/11/19   [provider]  valACYclovir (VALTREX) 500 MG tablet Take 1 tablet (500 mg total) by mouth daily. Can increase to twice a day for 5 days in the event of a recurrence Patient not taking: Reported on 11/09/2021 10/27/20   Hermina Staggers, MD    Physical Exam: Vitals:   11/09/21 0245 11/09/21 0330 11/09/21 0545 11/09/21 0612  BP: (!) 157/90 (!) 154/108 (!) 159/102   Pulse: 76 81 76 92  Resp: (!) 25 17 19 18   Temp:      TempSrc:      SpO2: 97% 99% 99% 99%  Weight:      Height:       Physical Exam Vitals and nursing note reviewed.  Constitutional:      Appearance: She is obese. She is not ill-appearing.  HENT:     Head: Normocephalic and atraumatic.     Mouth/Throat:     Mouth: Mucous membranes are moist.  Cardiovascular:     Rate and Rhythm: Normal rate and regular rhythm.  Abdominal:     General: Abdomen is flat. There is no distension.     Palpations: Abdomen is soft. There is no mass.     Tenderness: There is no abdominal tenderness.  Musculoskeletal:     Right lower leg: No edema.     Left lower leg: Edema present.  Skin:    General: Skin is warm and dry.     Capillary Refill: Capillary refill takes less than 2 seconds.     Comments: Redness and erythema left LE, pulses palpable throughout No loss of sensation or movement   Neurological:     Mental Status: She is alert.  Psychiatric:        Mood and Affect: Mood normal.        Behavior: Behavior normal.     Data Reviewed:     Latest Ref Rng & Units 11/09/2021    6:35 AM 11/08/2021    7:17 PM 05/14/2021     4:13 PM  CBC  WBC 4.0 - 10.5 K/uL 5.4  6.0  7.0   Hemoglobin 12.0 - 15.0 g/dL 05/16/2021  40.9  81.1   Hematocrit 36.0 - 46.0 % 42.1  44.7  46.0   Platelets 150 - 400 K/uL 288  326  226       Latest Ref Rng & Units 11/08/2021    7:17 PM 05/14/2021    4:13 PM 04/12/2009    3:10 PM  BMP  Glucose 70 - 99 mg/dL 04/14/2009  782  93   BUN 6 - 20 mg/dL 9  13  5   Creatinine 0.44 - 1.00 mg/dL 7.37  1.06  2.69   Sodium 135 - 145 mmol/L 138  135  135   Potassium 3.5 - 5.1 mmol/L 4.0  4.0  3.7   Chloride 98 - 111 mmol/L 105  98  101   CO2 22 - 32 mmol/L 24  27  28    Calcium 8.9 - 10.3 mg/dL 9.4  9.9  9.2    U/S LE:  - Findings consistent with chronic deep vein thrombosis involving the left  popliteal vein, and left gastrocnemius veins.  - No cystic structure found in the popliteal fossa.   - No evidence of common femoral vein obstruction.  CT PE: 1. No central or segmental pulmonary embolus. 2. Main pulmonary artery enlargement as well as pulmonary artery calcifications suggestive of pulmonary hypertension. 3. Nonspecific enlarged right hilar and interlobular lymph nodes. Recommend attention on follow-up.  Assessment and Plan:  Lynn Bates is a 51 y.o. female with medical history significant of HOCM, pacemaker, CHF, HIV, prior IVC filter with multiple blood clots, asthma, hypertension, diabetes, homelessness here for progressively worsening left foot and ankle swelling.  LLE cellulitis, nonpurulent LLE DVT Prior IVC Filter (2003) Progressively worsening left foot and ankle swelling for the past week, with associated redness, pain consistent with cellulitis.  Reports she has not been taking her Eliquis as prescribed, takes sporadically.  Ultrasound shows chronic DVT in her left lower extremity.  No evidence to suggest clot of IVC filter at this time so we will hold off on additional imaging.  Trigger cellulitis is likely due to her DVT. -Heparin with pharmacy consult -Cellulitis covered with  cefazolin -Wound care consult -Blood cultures pending -MRSA nares  CHF HOCM No evidence of overt volume overload on exam, otherwise well compensated.  We will continue her current heart failure medications. -Continue losartan 25 mg - On metoprolol titrate 50 mg twice daily  Asthma No evidence of wheezing on exam. -Continue albuterol as needed -Continue tiotropium  HIV -CD4 and RNA level -Continue Descovy 200-25 mg once daily --Continue raltegravir 400 mg twice a day  HTN -Hold amlodipine 10 mg  DM2 -Repeat A1c -Hold metformin -Continue statin  Homelessness Currently living in a car and has erratic access to medications.  DVT ppx: full dose heparin Advance Care Planning: Full Consults: pharmacy Family Communication: not at bedside during assessment  Severity of Illness: The appropriate patient status for this patient is INPATIENT. Inpatient status is judged to be reasonable and necessary in order to provide the required intensity of service to ensure the patient's safety. The patient's presenting symptoms, physical exam findings, and initial radiographic and laboratory data in the context of their chronic comorbidities is felt to place them at high risk for further clinical deterioration. Furthermore, it is not anticipated that the patient will be medically stable for discharge from the hospital within 2 midnights of admission.   * I certify that at the point of admission it is my clinical judgment that the patient will require inpatient hospital care spanning beyond 2 midnights from the point of admission due to high intensity of service, high risk for further deterioration and high frequency of surveillance required.*  Author: 02-22-1999, MD 11/09/2021 8:04 AM  For on call review [email or web address].

## 2021-11-09 NOTE — Progress Notes (Signed)
Pharmacy Antibiotic Note  Lynn Bates is a 51 y.o. female admitted on 11/08/2021 with cellulitis.  Pharmacy has been consulted for vancomycin dosing.  Plan: Vancomycin 2g IV x 1, then 1g IV q12h for estimated AUC 468 using SCr rounded to 0.8, Vd 0.5 Check vancomycin levels at steady state, goal 400-550 Follow up renal function & cultures  Height: 5\' 5"  (165.1 cm) Weight: 127.9 kg (282 lb) IBW/kg (Calculated) : 57  Temp (24hrs), Avg:97.6 F (36.4 C), Min:97.5 F (36.4 C), Max:97.6 F (36.4 C)  Recent Labs  Lab 11/08/21 1917 11/09/21 0010 11/09/21 0635 11/09/21 0859  WBC 6.0  --  5.4  --   CREATININE 0.78  --   --   --   LATICACIDVEN  --  1.3  --  1.3    Estimated Creatinine Clearance: 113.4 mL/min (by C-G formula based on SCr of 0.78 mg/dL).    No Known Allergies  Antimicrobials this admission: 6/28 Cefazolin x 1 6/28 Vancomycin >>  Dose adjustments this admission:  Microbiology results: 6/28 MRSA PCR: positive 6/28 BCx: sent  Thank you for allowing pharmacy to be a part of this patient's care.  7/28, PharmD, BCPS Pharmacy: [phone] 11/09/2021 8:44 PM

## 2021-11-09 NOTE — Progress Notes (Signed)
Chaplain engaged in an initial visit with Lynn Bates.  Lynn Bates shared about her needs for housing and that she has been living out of her car.  Lynn Bates is looking forward to speaking to a Child psychotherapist for resources and help.  Chaplain affirmed that a Child psychotherapist will be good to talk with.  Lynn Bates also voiced that she is a [place] with a relationship with God.  She values a Chaplain visit due to not being able to go to church recently because of her living situation.    Chaplain offered listening and support.  Lynn Bates wants to continue reflecting and having space to share her story.  Chaplain will follow-up tomorrow with Bible and more time to talk.    Lynn Bates also shared that she wanted a crossword puzzle or something to keep her busy.  Chaplain talked with staff and nurse tech voiced that she would print something off for Lynn Bates to use.     11/09/21 1600  Clinical Encounter Type  Visited With Patient  Visit Type Initial;Spiritual support  Stress Factors  Patient Stress Factors Major life changes;Loss of control;Lack of caregivers;Financial concerns

## 2021-11-09 NOTE — Progress Notes (Addendum)
ANTICOAGULATION CONSULT NOTE - Initial Consult  Pharmacy Consult for heparin (noncompliance to apixaban PTA) Indication: r/o DVT, possible ACS  No Known Allergies  Patient Measurements: Height: 5\' 5"  (165.1 cm) Weight: 127.9 kg (282 lb) IBW/kg (Calculated) : 57 Heparin Dosing Weight: 88.2 kg  Vital Signs: BP: 159/102 (06/28 0545) Pulse Rate: 92 (06/28 0612)  Labs: Recent Labs    11/08/21 1917 11/08/21 2045 11/09/21 0010 11/09/21 0635  HGB 14.4  --   --  13.5  HCT 44.7  --   --  42.1  PLT 326  --   --  288  APTT  --   --  26  --   HEPARINUNFRC  --   --  <0.10* 0.52  CREATININE 0.78  --   --   --   TROPONINIHS 93* 108*  --   --      Estimated Creatinine Clearance: 113.4 mL/min (by C-G formula based on SCr of 0.78 mg/dL).   Medical History: Past Medical History:  Diagnosis Date   Hypertension    Assessment: 51 yo F presenting with left lower extremity pain and swelling.  Patient is on apixaban PTA for hx PE/DVT.  Per MD assessment, patient reports noncompliance to apixaban and takes it sporadically.  Last dose of apixaban 6/26 AM per patient, however baseline heparin level undetectable indicating noncompliance to med. Troponins slightly elevated (93 >> 108).  Pharmacy is consulted to dose heparin drip for suspected DVT w/ apixaban noncompliance and possible ACS.  6/27 CT Angio: negative for PE.   Today, 11/09/21 - Heparin level therapeutic at 0.52 on heparin 1650 units/hr - CBC remains stable - No bleeding and no issues with heparin infusion reported  Goal of Therapy:  Heparin level 0.3-0.7 units/ml Monitor platelets by anticoagulation protocol: Yes   Plan:  Continue heparin drip at 1650 units/hr Check confirmatory heparin level in 6 hours  Daily CBC, heparin level & aPTT  11/11/21, PharmD 11/09/2021 7:28 AM

## 2021-11-09 NOTE — ED Provider Notes (Signed)
Care assumed from Dr. Clarice Pole awaiting callback from hospitalist.  I have spoken with Dr. Loney Loh who will evaluate and admit.   Geoffery Lyons, MD 11/09/21 [phone]

## 2021-11-09 NOTE — Progress Notes (Signed)
ANTICOAGULATION CONSULT NOTE  Pharmacy Consult for heparin (noncompliance to apixaban PTA) Indication: r/o DVT, possible ACS  No Known Allergies  Patient Measurements: Height: 5\' 5"  (165.1 cm) Weight: 127.9 kg (282 lb) IBW/kg (Calculated) : 57 Heparin Dosing Weight: 88.2 kg  Vital Signs: Temp: 97.6 F (36.4 C) (06/28 1302) BP: 134/104 (06/28 1302) Pulse Rate: 74 (06/28 1302)  Labs: Recent Labs    11/08/21 1917 11/08/21 2045 11/09/21 0010 11/09/21 0635 11/09/21 1308  HGB 14.4  --   --  13.5  --   HCT 44.7  --   --  42.1  --   PLT 326  --   --  288  --   APTT  --   --  26  --   --   HEPARINUNFRC  --   --  <0.10* 0.52 0.52  CREATININE 0.78  --   --   --   --   TROPONINIHS 93* 108*  --   --   --      Estimated Creatinine Clearance: 113.4 mL/min (by C-G formula based on SCr of 0.78 mg/dL).   Medical History: Past Medical History:  Diagnosis Date   Hypertension    Assessment: 51 yo F presenting with left lower extremity pain and swelling.  Patient is on apixaban PTA for hx PE/DVT.  Per MD assessment, patient reports noncompliance to apixaban and takes it sporadically.  Last dose of apixaban 6/26 AM per patient, however baseline heparin level undetectable indicating noncompliance to med. Troponins slightly elevated (93 >> 108).  Pharmacy is consulted to dose heparin drip for suspected DVT w/ apixaban noncompliance and possible ACS.  6/27 CT Angio: negative for PE.   Today, 11/09/21 - Confirmatory heparin level remains therapeutic on 1650 units/hr - CBC remains stable - No bleeding and no issues with heparin infusion reported - Per MD, OK to transition to Lovenox  Goal of Therapy:  Heparin level 0.3-0.7 units/ml Monitor platelets by anticoagulation protocol: Yes   Plan:  Start Lovenox 120 mg SQ q12 hr Stop heparin with first dose of Lovenox F/u plans for anticoagulation at discharge; likely pt will need financial assistance; may be candidate for [facility]  [facility], PharmD, BCPS [phone] 11/09/2021, 1:54 PM

## 2021-11-09 NOTE — Progress Notes (Signed)
Left lower extremity venous duplex has been completed. Preliminary results can be found in CV Proc through chart review.  Results were given to Dr. Allena Katz.  11/09/21 9:41 AM Olen Cordial RVT

## 2021-11-09 NOTE — ED Provider Notes (Signed)
[facility]-EMERGENCY DEPT Provider Note   CSN: [id number] Arrival date & time: 11/08/21  1812     History  Chief Complaint  Patient presents with   Leg Swelling    Bilateral     Lynn Bates is a 51 y.o. female.  HPI Patient has complex medical history including history of DVT and HIV.  As well as hypertrophic cardiomyopathy, diabetes, hypertension and hyperlipidemia.  Patient reports she started developing pain and swelling particularly in the left lower extremity.  She reports she also has swelling in the right lower extremity although that is been more variable and is gone down.  Patient reports she got concerned because a small wound opened up on the left lower extremity and has become very painful to walk on.  Patient reports she has been sporadic about taking her Eliquis.  This is multifactorial.  The patient reports she has been homeless off and on and living out of her car with her children.  She is both trying to economize the Eliquis as well as simply forgetting it due to complications of life.  Patient describes a lot of recent social stressors.  She has intermittently been living in a hotel or out of her car.  Patient reports that she chronically feels short of breath.  She is not sure if she is more short of breath than baseline.  She denies any chest pain at this time.  She has not had any syncopal episodes.  No fevers or productive cough.    Home Medications Prior to Admission medications   Medication Sig Start Date End Date Taking? Authorizing Provider  albuterol (VENTOLIN HFA) 108 (90 Base) MCG/ACT inhaler Inhale 1 puff into the lungs as directed. 02/16/15   [provider]  amLODipine (NORVASC) 10 MG tablet Take 10 mg by mouth at bedtime. 06/16/19   [provider]  apixaban (ELIQUIS) 5 MG TABS tablet Take 5 mg by mouth in the morning and at bedtime. 10/01/18   [provider]  budesonide-formoterol (SYMBICORT) 160-4.5 MCG/ACT  inhaler Inhale 1 puff into the lungs in the morning and at bedtime.    [provider]  emtricitabine-tenofovir (TRUVADA) 200-300 MG tablet Take by mouth as directed. 05/28/09   [provider]  famotidine (PEPCID) 20 MG tablet Take 20 mg by mouth in the morning and at bedtime. 10/10/18   [provider]  furosemide (LASIX) 20 MG tablet Take 20 mg by mouth in the morning and at bedtime. 06/16/19   [provider]  latanoprost (XALATAN) 0.005 % ophthalmic solution Place 1 drop into both eyes at bedtime. 02/02/17   [provider]  linaclotide (LINZESS) 145 MCG CAPS capsule Take 145 mcg by mouth daily. 06/05/17   [provider]  meclizine (ANTIVERT) 25 MG tablet Take 1 tablet (25 mg total) by mouth 3 (three) times daily as needed for dizziness. 05/15/21   Couture, Cortni S, PA-C  methocarbamol (ROBAXIN) 750 MG tablet Take 750 mg by mouth in the morning, at noon, and at bedtime. 06/24/19   [provider]  metoprolol tartrate (LOPRESSOR) 50 MG tablet Take 50 mg by mouth in the morning and at bedtime. 10/07/15   [provider]  montelukast (SINGULAIR) 10 MG tablet Take 10 mg by mouth as directed. 02/22/19   [provider]  pantoprazole (PROTONIX) 40 MG tablet Take 40 mg by mouth daily. 04/25/15   [provider]  pravastatin (PRAVACHOL) 20 MG tablet Take 20 mg by mouth daily.  04/17/15   [provider]  predniSONE (DELTASONE) 10 MG tablet Take 10 mg by mouth as directed. 07/13/17   [provider]  raltegravir (ISENTRESS) 400 MG tablet Take 400 mg by mouth in the morning and at bedtime. 08/03/09   [provider]  ranitidine (ZANTAC) 300 MG tablet Take 300 mg by mouth as directed. 04/30/15   [provider]  risperiDONE (RISPERDAL) 2 MG tablet Take 2 mg by mouth daily.    [provider]  rosuvastatin (CRESTOR) 10 MG tablet Take 10 mg by mouth at bedtime. 05/23/19   [provider]  Semaglutide,0.25 or 0.5MG /DOS, (OZEMPIC, 0.25 OR 0.5 MG/DOSE,) 2 MG/1.5ML SOPN Inject 0.25 mg into the skin as directed. 10/25/18   [provider]  Tiotropium Bromide Monohydrate 2.5 MCG/ACT AERS Inhale 2 puffs into the lungs daily.    [provider]  tiZANidine (ZANAFLEX) 2 MG tablet Take 2 mg by mouth as directed. 02/02/17   [provider]  topiramate (TOPAMAX) 25 MG capsule Take 25 mg by mouth in the morning and at bedtime.    [provider]  traZODone (DESYREL) 100 MG tablet Take 100 mg by mouth at bedtime.    [provider]  triamcinolone cream (KENALOG) 0.1 % Apply 1 application topically in the morning and at bedtime. 06/11/19   [provider]  valACYclovir (VALTREX) 500 MG tablet Take 1 tablet (500 mg total) by mouth daily. Can increase to twice a day for 5 days in the event of a recurrence 10/27/20   Hermina Staggers, MD      Allergies    Patient has no known allergies.    Review of Systems   Review of Systems 10 systems reviewed and negative except as per HPI Physical Exam Updated Vital Signs BP (!) 155/84   Pulse 84   Temp 97.8 F (36.6 C) (Oral)   Resp (!) 25   Ht 5\' 5"  (1.651 m)   Wt 127.9 kg   SpO2 96%   BMI 46.93 kg/m  Physical Exam Constitutional:      Comments: Patient is alert and nontoxic.  No respiratory distress at rest.  HENT:     Head: Normocephalic and atraumatic.     Mouth/Throat:     Pharynx: Oropharynx is clear.  Eyes:     Extraocular Movements: Extraocular movements intact.  Cardiovascular:     Rate and Rhythm: Normal rate and regular rhythm.  Pulmonary:     Effort: Pulmonary effort is normal.     Breath sounds: Normal breath sounds.  Abdominal:     General: There is no distension.     Palpations: Abdomen is soft.     Tenderness: There is no abdominal tenderness. There is no guarding.  Musculoskeletal:     Comments: Patient is about 2+ swelling of the left lower  extremity.  It is diffusely warmer to the touch than the right.  There is a small erosion wound to the anterior aspect of the lower leg about 5 mm.  No active drainage from this.  Both feet are warm and dry with 2+ pulses.  Skin:    General: Skin is warm and dry.  Neurological:     General: No focal deficit present.     Mental Status: She is oriented to person, place, and time.     Motor: No weakness.     Coordination: Coordination normal.  Psychiatric:        Mood and Affect: Mood normal.  ED Results / Procedures / Treatments   Labs (all labs ordered are listed, but only abnormal results are displayed) Labs Reviewed  BASIC METABOLIC PANEL - Abnormal; Notable for the following components:      Result Value   Glucose, Bld 354 (*)    All other components within normal limits  CBC - Abnormal; Notable for the following components:   RBC 5.17 (*)    All other components within normal limits  TROPONIN I (HIGH SENSITIVITY) - Abnormal; Notable for the following components:   Troponin I (High Sensitivity) 93 (*)    All other components within normal limits  TROPONIN I (HIGH SENSITIVITY) - Abnormal; Notable for the following components:   Troponin I (High Sensitivity) 108 (*)    All other components within normal limits  CULTURE, BLOOD (ROUTINE X 2)  CULTURE, BLOOD (ROUTINE X 2)  BRAIN NATRIURETIC PEPTIDE  LACTIC ACID, PLASMA  LACTIC ACID, PLASMA  HEPARIN LEVEL (UNFRACTIONATED)  APTT    EKG EKG Interpretation  Date/Time:  Tuesday November 08 2021 18:30:44 EDT Ventricular Rate:  97 PR Interval:  171 QRS Duration: 103 QT Interval:  385 QTC Calculation: 490 R Axis:   55 Text Interpretation: Sinus rhythm Biatrial enlargement Borderline prolonged QT interval Baseline wander in lead(s) V6 no sig change from previous Confirmed by Arby Barrette [phone]) on 11/08/2021 9:20:55 PM  Radiology CT Angio Chest PE W/Cm &/Or Wo Cm  Result Date: 11/08/2021 CLINICAL DATA:  C/o bilateral lower  extremity swelling and edema. Pulmonary embolism (PE) suspected, high prob EXAM: CT ANGIOGRAPHY CHEST WITH CONTRAST TECHNIQUE: Multidetector CT imaging of the chest was performed using the standard protocol during bolus administration of intravenous contrast. Multiplanar CT image reconstructions and MIPs were obtained to evaluate the vascular anatomy. RADIATION DOSE REDUCTION: This exam was performed according to the departmental dose-optimization program which includes automated exposure control, adjustment of the mA and/or kV according to patient size and/or use of iterative reconstruction technique. CONTRAST:  OMNIPAQUE IOHEXOL 350 MG/ML SOLN COMPARISON:  None Available. FINDINGS: Cardiovascular: 2 lead cardiac pacemaker partially visualized in grossly appropriate position. Satisfactory opacification of the pulmonary arteries to the segmental level. No evidence of pulmonary embolism. The main pulmonary artery is enlarged in caliber measuring up to 3.4 cm. Calcifications are noted within the left lower lobe and right lower lobe pulmonary arteries. Limited evaluation of the subsegmental level to artifact. Normal heart size. No significant pericardial effusion. The thoracic aorta is normal in caliber. No atherosclerotic plaque of the thoracic aorta. No coronary artery calcifications. Mediastinum/Nodes: Enlarged right hilar and interlobular lymph nodes (5:109, 148) with as an example a 1.3 cm node. No mediastinal or left hilar or axillary lymph nodes. Thyroid gland, trachea, and esophagus demonstrate no significant findings. Lungs/Pleura: No focal consolidation. No pulmonary nodule. No pulmonary mass. No pleural effusion. No pneumothorax. Upper Abdomen: No acute abnormality. Musculoskeletal: No chest wall abnormality. No suspicious lytic or blastic osseous lesions. No acute displaced fracture. Multilevel degenerative changes of the spine. Review of the MIP images confirms the above findings. IMPRESSION: 1. No  central or segmental pulmonary embolus. 2. Main pulmonary artery enlargement as well as pulmonary artery calcifications suggestive of pulmonary hypertension. 3. Nonspecific enlarged right hilar and interlobular lymph nodes. Recommend attention on follow-up. Electronically Signed   By: Tish Frederickson M.D.   On: 11/08/2021 23:04   DG Chest 2 View  Result Date: 11/08/2021 CLINICAL DATA:  BILATERAL lower extremity swelling and edema, shortness of breath EXAM: CHEST - 2 VIEW  COMPARISON:  07/21/2009; interval exam is not available for comparison FINDINGS: LEFT subclavian pacemaker with leads projecting at RIGHT atrium and RIGHT ventricle. Enlargement of cardiac silhouette with pulmonary vascular congestion. Increased markings at lung bases likely related to superimposed soft tissue. No definite acute infiltrate, pleural effusion, or pneumothorax. Osseous structures unremarkable. IMPRESSION: Enlargement of cardiac silhouette with vascular congestion post pacemaker. No definite acute infiltrate. Electronically Signed   By: Ulyses Southward M.D.   On: 11/08/2021 19:01    Procedures Procedures   CRITICAL CARE Performed by: Arby Barrette   Total critical care time: 30 minutes  Critical care time was exclusive of separately billable procedures and treating other patients.  Critical care was necessary to treat or prevent imminent or life-threatening deterioration.  Critical care was time spent personally by me on the following activities: development of treatment plan with patient and/or surrogate as well as nursing, discussions with consultants, evaluation of patient's response to treatment, examination of patient, obtaining history from patient or surrogate, ordering and performing treatments and interventions, ordering and review of laboratory studies, ordering and review of radiographic studies, pulse oximetry and re-evaluation of patient's condition.  Medications Ordered in ED Medications  heparin ADULT  infusion 100 units/mL (25000 units/275mL) (has no administration in time range)  heparin bolus via infusion 3,000 Units (has no administration in time range)  ceFAZolin (ANCEF) IVPB 1 g/50 mL premix (has no administration in time range)  iohexol (OMNIPAQUE) 350 MG/ML injection 100 mL (100 mLs Intravenous Contrast Given 11/08/21 2239)    ED Course/ Medical Decision Making/ A&P                           Medical Decision Making Amount and/or Complexity of Data Reviewed Labs: ordered. Radiology: ordered.  Risk Prescription drug management. Decision regarding hospitalization.   Patient has significant comorbid conditions.  She has known underlying cardiac disease\DVT\HIV.  Patient presents with a red swollen left lower extremity.  There is a small erosive wound on the front of the leg without active drainage.  Differential diagnosis includes DVT\cellulitis.  Diagnostic work-up included CT PE study  Troponins are mild to moderately elevated but fairly flat.  At this time concern for PE versus mild demand ischemia.  Doubt acute MI.  Heparin started empirically with patient having noncompliance with Eliquis.  PE study has ruled out for any central or large PE.  We will continue heparin for suspected DVT with Eliquis noncompliance and possible ACS although patient is not having any active chest pain.  Patient is lower extremity also may have secondary cellulitis.  Patient has risk factors of HIV with immunosuppression.  Will empirically start antibiotic therapy as well.  Patient will need admission for ongoing treatment of cellulitis versus DVT with anticipated ultrasound tomorrow morning.  Consult: Internal medicine teaching service for admission.        Final Clinical Impression(s) / ED Diagnoses Final diagnoses:  Left leg swelling  Cellulitis of left lower extremity  Elevated troponin    Rx / DC Orders ED Discharge Orders     None         Arby Barrette, MD 11/11/21  1159

## 2021-11-10 DIAGNOSIS — I82531 Chronic embolism and thrombosis of right popliteal vein: Secondary | ICD-10-CM

## 2021-11-10 DIAGNOSIS — B20 Human immunodeficiency virus [HIV] disease: Secondary | ICD-10-CM

## 2021-11-10 DIAGNOSIS — E785 Hyperlipidemia, unspecified: Secondary | ICD-10-CM

## 2021-11-10 DIAGNOSIS — R778 Other specified abnormalities of plasma proteins: Secondary | ICD-10-CM

## 2021-11-10 DIAGNOSIS — L02416 Cutaneous abscess of left lower limb: Secondary | ICD-10-CM

## 2021-11-10 DIAGNOSIS — K219 Gastro-esophageal reflux disease without esophagitis: Secondary | ICD-10-CM

## 2021-11-10 DIAGNOSIS — L03116 Cellulitis of left lower limb: Secondary | ICD-10-CM

## 2021-11-10 DIAGNOSIS — Z86711 Personal history of pulmonary embolism: Secondary | ICD-10-CM

## 2021-11-10 DIAGNOSIS — I10 Essential (primary) hypertension: Secondary | ICD-10-CM

## 2021-11-10 DIAGNOSIS — B009 Herpesviral infection, unspecified: Secondary | ICD-10-CM | POA: Insufficient documentation

## 2021-11-10 DIAGNOSIS — F339 Major depressive disorder, recurrent, unspecified: Secondary | ICD-10-CM

## 2021-11-10 DIAGNOSIS — E1165 Type 2 diabetes mellitus with hyperglycemia: Secondary | ICD-10-CM

## 2021-11-10 DIAGNOSIS — E1169 Type 2 diabetes mellitus with other specified complication: Secondary | ICD-10-CM

## 2021-11-10 DIAGNOSIS — G4730 Sleep apnea, unspecified: Secondary | ICD-10-CM

## 2021-11-10 DIAGNOSIS — Z8672 Personal history of thrombophlebitis: Secondary | ICD-10-CM | POA: Insufficient documentation

## 2021-11-10 LAB — CBC
HCT: 40.1 % (ref 36.0–46.0)
Hemoglobin: 12.9 g/dL (ref 12.0–15.0)
MCH: 28.1 pg (ref 26.0–34.0)
MCHC: 32.2 g/dL (ref 30.0–36.0)
MCV: 87.4 fL (ref 80.0–100.0)
Platelets: 261 10*3/uL (ref 150–400)
RBC: 4.59 MIL/uL (ref 3.87–5.11)
RDW: 12.4 % (ref 11.5–15.5)
WBC: 4.6 10*3/uL (ref 4.0–10.5)
nRBC: 0 % (ref 0.0–0.2)

## 2021-11-10 LAB — GLUCOSE, CAPILLARY
Glucose-Capillary: 297 mg/dL — ABNORMAL HIGH (ref 70–99)
Glucose-Capillary: 280 mg/dL — ABNORMAL HIGH (ref 70–99)
Glucose-Capillary: 201 mg/dL — ABNORMAL HIGH (ref 70–99)
Glucose-Capillary: 308 mg/dL — ABNORMAL HIGH (ref 70–99)

## 2021-11-10 LAB — COMPREHENSIVE METABOLIC PANEL
ALT: 15 U/L (ref 0–44)
AST: 18 U/L (ref 15–41)
Albumin: 3.2 g/dL — ABNORMAL LOW (ref 3.5–5.0)
Alkaline Phosphatase: 81 U/L (ref 38–126)
Anion gap: 6 (ref 5–15)
BUN: 10 mg/dL (ref 6–20)
CO2: 26 mmol/L (ref 22–32)
Calcium: 8.9 mg/dL (ref 8.9–10.3)
Chloride: 105 mmol/L (ref 98–111)
Creatinine, Ser: 0.59 mg/dL (ref 0.44–1.00)
GFR, Estimated: >60 mL/min (ref >60)
Glucose, Bld: 313 mg/dL — ABNORMAL HIGH (ref 70–99)
Potassium: 4.1 mmol/L (ref 3.5–5.1)
Sodium: 137 mmol/L (ref 135–145)
Total Bilirubin: 0.5 mg/dL (ref 0.3–1.2)
Total Protein: 6.9 g/dL (ref 6.5–8.1)

## 2021-11-10 LAB — CK: Total CK: 28 U/L — ABNORMAL LOW (ref 38–234)

## 2021-11-10 MED ORDER — GABAPENTIN 100 MG PO CAPS
200.0000 mg | ORAL_CAPSULE | Freq: Two times a day (BID) | ORAL | Status: DC
Start: 2021-11-11 — End: 2021-11-11
  Administered 2021-11-11 (×2): 200 mg via ORAL
  Filled 2021-11-10 (×2): qty 2

## 2021-11-10 MED ORDER — TRAMADOL HCL 50 MG PO TABS
50.0000 mg | ORAL_TABLET | Freq: Three times a day (TID) | ORAL | Status: DC | PRN
  Administered 2021-11-10: 50 mg via ORAL
  Filled 2021-11-10: qty 1

## 2021-11-10 MED ORDER — ALUM & MAG HYDROXIDE-SIMETH 200-200-20 MG/5ML PO SUSP
30.0000 mL | Freq: Three times a day (TID) | ORAL | Status: DC | PRN
  Administered 2021-11-10: 30 mL via ORAL
  Filled 2021-11-10: qty 30

## 2021-11-10 MED ORDER — CHLORHEXIDINE GLUCONATE CLOTH 2 % EX PADS
6.0000 | MEDICATED_PAD | Freq: Every day | CUTANEOUS | Status: DC
  Administered 2021-11-10 – 2021-11-11 (×2): 6 via TOPICAL

## 2021-11-10 MED ORDER — GABAPENTIN 300 MG PO CAPS
300.0000 mg | ORAL_CAPSULE | Freq: Every day | ORAL | Status: DC
Start: 2021-11-10 — End: 2021-11-11
  Administered 2021-11-10: 300 mg via ORAL
  Filled 2021-11-10: qty 1

## 2021-11-10 MED ORDER — INSULIN ASPART 100 UNIT/ML IJ SOLN
0.0000 [IU] | Freq: Three times a day (TID) | INTRAMUSCULAR | Status: DC
  Administered 2021-11-10: 5 [IU] via SUBCUTANEOUS
  Administered 2021-11-10 (×2): 8 [IU] via SUBCUTANEOUS

## 2021-11-10 MED ORDER — APIXABAN 5 MG PO TABS
5.0000 mg | ORAL_TABLET | Freq: Two times a day (BID) | ORAL | Status: DC
  Administered 2021-11-10 – 2021-11-11 (×2): 5 mg via ORAL
  Filled 2021-11-10 (×2): qty 1

## 2021-11-10 MED ORDER — INSULIN ASPART 100 UNIT/ML IJ SOLN
0.0000 [IU] | Freq: Every day | INTRAMUSCULAR | Status: DC
  Administered 2021-11-10: 4 [IU] via SUBCUTANEOUS

## 2021-11-10 MED ORDER — ACETAMINOPHEN 500 MG PO TABS
1000.0000 mg | ORAL_TABLET | Freq: Three times a day (TID) | ORAL | Status: DC | PRN
Start: 2021-11-10 — End: 2021-11-11

## 2021-11-10 MED ORDER — MUPIROCIN 2 % EX OINT
1.0000 | TOPICAL_OINTMENT | Freq: Two times a day (BID) | CUTANEOUS | Status: DC
  Administered 2021-11-10 – 2021-11-11 (×3): 1 via NASAL
  Filled 2021-11-10 (×2): qty 22

## 2021-11-10 NOTE — TOC Transition Note (Signed)
Transition of Care [facility]) - CM/SW Discharge Note   Patient Details  Name: Lynn Bates MRN: [id number] Date of Birth: May 25, 1970  Transition of Care [facility]) CM/SW Contact:  Vassie Moselle, LCSW Phone Number: 11/10/2021, 10:01 AM   Clinical Narrative:    TOC consulted for medication assistance and homeless issues. Met with pt who was pleasant and engaged. Pt was alert and oriented x 4. Pt shared that she was homeless for a short period of time in 2019 from June to September. She was able to move into an apartment in September of 2019 with her 3 children (twins now aged 75 and son 15y.o.). She and all three children had been receiving disability income however, her two oldest children who are now adults had their check cut off due to issues with the re-certification. Since living in her apartment they dealt with mold and roaches. She would continuously contact the property manager who would come out to evaluate and make promises to have her apartment treated however, there was never any follow through. She stopped paying her rent due to their lack of follow through and due to having family income decreased. DSS assisted with paying her rent from 10/2020 to 02/2021 and then again from 06/2021 to 09/2021. She was unable to continue to afford paying her rent due to rent being over half of monthly income. She was given a 10 day eviction notice at the beginning of May 2023. Since being evicted she and her children have been living in motels when they are able to afford it or sleeping in her car when they cannot afford a motel room. Her car has also broken down several times and has left them stranded.  Pt is connected with [facility] for HIV maintenance and has a case manager who has been assisting with housing and other needs. She does not want to go to a shelter due to not wanting to separate from her children. [facility] [place] is currently closed for remodeling. Pt is  also connected with vocational rehabilitation that has been helping her and her children with employment and other resources. Pt has an uncle who is a Company secretary and he and his church support them as they can. Pt has been on the waitlist for section 8 housing since 08/2020 however, there is a 2 year waitlist for housing. Pt prioritizes food and shelter over obtaining her medications. Pt has Medicaid and will not qualify for any other medication assistance programs. Pt states her children are currently in a motel that is paid through Saturday. CSW provided this pt with additional community resources for housing that she states she has not been connected to. No further TOC signing off at this time.      Barriers to Discharge: Financial Resources, Homeless with medical needs, Continued Medical Work up   Patient Goals and CMS Choice Patient states their goals for this hospitalization and ongoing recovery are:: To return home with kids   Choice offered to / list presented to : Patient  Discharge Placement                       Discharge Plan and Services In-house Referral: Clinical Social Work Discharge Planning Services: CM Consult            DME Arranged: N/A DME Agency: NA                  Social Determinants of Health ([place])  Interventions     Readmission Risk Interventions    11/10/2021    9:58 AM  Readmission Risk Prevention Plan  Post Dischage Appt Complete  Medication Screening Complete  Transportation Screening Complete

## 2021-11-10 NOTE — Progress Notes (Signed)
ANTICOAGULATION CONSULT NOTE  Pharmacy Consult for apixaban Indication: Hx of PE, chronic DVT  No Known Allergies  Patient Measurements: Height: 5\' 5"  (165.1 cm) Weight: 127.9 kg (282 lb) IBW/kg (Calculated) : 57 Heparin Dosing Weight: 88.2 kg  Vital Signs: Temp: 97.5 F (36.4 C) (06/29 0103) Temp Source: Oral (06/29 0103) BP: 130/49 (06/29 0103) Pulse Rate: 72 (06/29 0103)  Labs: Recent Labs    11/08/21 1917 11/08/21 2045 11/09/21 0010 11/09/21 0635 11/09/21 1308 11/10/21 0456  HGB 14.4  --   --  13.5  --  12.9  HCT 44.7  --   --  42.1  --  40.1  PLT 326  --   --  288  --  261  APTT  --   --  26  --   --   --   HEPARINUNFRC  --   --  <0.10* 0.52 0.52  --   CREATININE 0.78  --   --   --   --  0.59  TROPONINIHS 93* 108*  --   --   --   --     Estimated Creatinine Clearance: 113.4 mL/min (by C-G formula based on SCr of 0.59 mg/dL).  Medical History: Past Medical History:  Diagnosis Date   Hypertension    Assessment: 51 yo F presenting with left lower extremity pain and swelling.  Patient is on apixaban PTA for hx PE/DVT.  Per MD assessment, patient reports noncompliance to apixaban and takes it sporadically.  Last dose of apixaban 6/26 AM per patient, however baseline heparin level undetectable indicating noncompliance to med. Troponins slightly elevated (93 >> 108).  Pharmacy originally consulted to start heparin, then transitioned to enoxaparin, now back to PTA apixaban. TOC consulted by primary for medication assistance.   Plan:  Stop enoxaparin Restart apixaban 5mg  PO BID tonight Monitor CBC, s/s of bleed  7/26, PharmD, BCPS, BCIDP Clinical Pharmacist 11/10/2021 12:48 PM

## 2021-11-10 NOTE — Progress Notes (Signed)
Chaplain engaged in a follow-up visit.  Lynn Bates shared about the many challenges she has faced before becoming homeless and living out out of her car.  She has had a hard time finding an apartment and resources for her family.  She shared that the Child psychotherapist gave her a list of places to call.  She also voiced that she has a motel to stay in until Saturday.  After that, she and her children will have no where to go.  Chaplain affirmed calling the list of numbers and places given by the Child psychotherapist.  Chaplain isn't aware of any resources for housing, but provided space for Lynn Bates to share about her life, healthcare journey and current needs.   Chaplain provided Lynn Bates with a Bible as her faith is really important to her.  Chaplain is available to follow-up as needed.     11/10/21 1100  Clinical Encounter Type  Visited With Patient  Visit Type Follow-up

## 2021-11-10 NOTE — Progress Notes (Signed)
PROGRESS NOTE  Lynn Bates TKW:[id number] DOB: [birth year]   PCP: [facility]  Patient is from: Motel  DOA: 11/08/2021 LOS: 1  Chief complaints Chief Complaint  Patient presents with   Leg Swelling    Bilateral      Brief Narrative / Interim history: 51 year old F with PMH of HOCM s/p PPM in 2009, uncontrolled NIDDM-2, morbid obesity, HTN, sleep apnea, PE and DVT s/p IVC filter in 2003, thyroid nodules and noncompliance with medication presenting with left foot and ankle swelling, pain, subjective fever and chills for about a week, and admitted for left lower extremity cellulitis.  She was noncompliant with medications.  Reports difficulty affording the co-pays.  She says she is currently homeless staying at a motel with her son.  In ED, vital stable.  Afebrile.  Glucose 354.  BNP 95.  CBC normal.  Troponin 93> 108.  Twelve-lead EKG sinus rhythm with slight bilateral atrial enlargement.  Lactic acid negative.  CXR with enlargement of cardiac silhouette with vascular congestion.  CTA chest negative for PE but suggestive for pulm hypertension and nonspecific enlarged right hilar and interlobular lymph nodes.  LE venous Doppler chronic deep vein thrombosis involving the left popliteal vein, and left gastrocnemius veins.   Patient was started on IV vancomycin, and admitted.  A1c 9.4% in 07/2021  Subjective: Seen and examined earlier this morning.  No major events overnight of this morning.  She reports pain in her left leg.  She rates her pain 8/10.  Pain is on and off.  She describes the pain as sharp.  No provoking or alleviating factor.  Also reports swelling.  Denies chest pain, dyspnea, GI or UTI symptoms.  Admits to noncompliance with medication probably due to difficulty affording co-pays.  She states she lives in a motel with his son.  Reports a lot of stressors.  Objective: Vitals:   11/09/21 1302 11/09/21 1658 11/09/21 2114 11/10/21 0103  BP: (!) 134/104 122/78 (!)  143/80 (!) 130/49  Pulse: 74 80 80 72  Resp: 18 16 14 18   Temp: 97.6 F (36.4 C) (!) 97.5 F (36.4 C) 97.6 F (36.4 C) (!) 97.5 F (36.4 C)  TempSrc:   Oral Oral  SpO2: 100% 99% 94% 98%  Weight:      Height:        Examination:  GENERAL: No apparent distress.  Nontoxic. HEENT: MMM.  Vision and hearing grossly intact.  NECK: Supple.  No apparent JVD.  RESP:  No IWOB.  Fair aeration bilaterally. CVS:  RRR. Heart sounds normal.  ABD/GI/GU: BS+. Abd soft, NTND.  MSK/EXT:  Moves extremities.  Symmetric LLE edema and swelling.  No significant erythema but difficult due to skin color.  No increased warmth to touch.  Small skin scab over lower shin.  No abscess or loculation.  Slight tenderness to palpation. SKIN: As above. NEURO: Awake, alert and oriented appropriately.  No apparent focal neuro deficit. PSYCH: Calm. Normal affect.   Procedures:  None  Microbiology summarized: MRSA PCR screen positive. Blood cultures NGTD.  Assessment and plan: Principal Problem:   Cellulitis and abscess of left lower extremity Active Problems:   Chronic deep vein thrombosis (DVT) of popliteal vein of right lower extremity (HCC)   Essential hypertension   Gastroesophageal reflux disease without esophagitis   History of pulmonary embolus (PE)   Hyperlipidemia due to type 2 diabetes mellitus (HCC)   Human immunodeficiency virus (HIV) disease (HCC)   Major depressive disorder, recurrent episode (HCC)  Morbid (severe) obesity due to excess calories (HCC)   Obstructive hypertrophic cardiomyopathy (HCC)   Pulmonary embolism (HCC)   Sleep apnea   Type 2 diabetes mellitus, without long-term current use of insulin (HCC)   Elevated troponin     Nonpurulent LLE cellulitis in patient with HIV and uncontrolled diabetes.  MRSA PCR nonreactive.  Blood cultures negative.  Patient reports subjective fever but no objective fever.  Notable significant swelling and some tenderness on exam.  Endorse  significant pain.  She has good DP pulses bilaterally.  Chronic LLE DVT could also contribute to her leg swelling.  -Continue IV vancomycin for today.  We will transition to p.o. doxycycline prior to discharge -Glycemic control -Leg elevation -Trial of gabapentin for possible neuropathic pain -PT/OT eval  History of pulmonary embolism-CTA chest negative for PE. Chronic LLE DVT-LE venous Doppler with chronic distal DVT History of IVC filter in 2003 -Resume home p.o. Eliquis. -Discussed the importance of compliance -TOC consulted for assistance with medication.  She has Medicaid   Chronic diastolic CHF/HOCM s/p PPM in 2009: No cardiopulmonary symptoms. -Continue home meds  Elevated troponin: Likely demand ischemia.  No cardiopulmonary symptoms. -No further work-up   History of asthma History of sleep apnea-not on CPAP -Continue as needed inhalers   HIV: CD4 count 579. -Continue home medication-Descovy and raltegravir -Follow HIV RNA   Essential hypertension: Relatively stable. -Hold amlodipine 10 mg -Continue other cardiac meds   Uncontrolled NIDDM-2 with hyperglycemia: A1c 9.4% in 07/2021. -Start CBG monitoring and SSI -Check hemoglobin A1c -Hold home metformin and Ozempic -Continue home statin   Homelessness-lives in motel. -TOC consulted  History of depression: Stable. -Continue home medication   Morbid obesity Body mass index is 46.93 kg/m. -Already on Ozempic -Encourage lifestyle change to lose weight.          DVT prophylaxis:  On Eliquis  Code Status: Full code Family Communication: None at bedside Level of care: Med-Surg Status is: Inpatient Remains inpatient appropriate because: Lower extremity cellulitis and pain   Final disposition: Likely back to motel in the next 24 to 48 hours Consultants:  None  Sch Meds:  Scheduled Meds:  Chlorhexidine Gluconate Cloth  6 each Topical Q0600   emtricitabine-tenofovir AF  1 tablet Oral Daily    enoxaparin (LOVENOX) injection  120 mg Subcutaneous Q12H   insulin aspart  0-15 Units Subcutaneous TID WC   insulin aspart  0-5 Units Subcutaneous QHS   losartan  25 mg Oral Daily   metoprolol tartrate  50 mg Oral BID   montelukast  10 mg Oral QHS   mupirocin ointment  1 Application Nasal BID   pantoprazole  40 mg Oral Daily   pravastatin  20 mg Oral q1800   raltegravir  400 mg Oral BID   risperiDONE  2 mg Oral Daily   Continuous Infusions:  vancomycin     PRN Meds:.acetaminophen, alum & mag hydroxide-simeth  Antimicrobials: Anti-infectives (From admission, onward)    Start     Dose/Rate Route Frequency Ordered Stop   11/10/21 1200  vancomycin (VANCOCIN) IVPB 1000 mg/200 mL premix        1,000 mg 200 mL/hr over 60 Minutes Intravenous Every 12 hours 11/09/21 2046     11/09/21 2145  vancomycin (VANCOREADY) IVPB 2000 mg/400 mL        2,000 mg 200 mL/hr over 120 Minutes Intravenous  Once 11/09/21 2045 11/10/21 0048   11/09/21 2130  vancomycin (VANCOCIN) 1,920 mg in sodium chloride 0.9 % 500  mL IVPB  Status:  Discontinued        15 mg/kg  127.9 kg 259.6 mL/hr over 120 Minutes Intravenous Every 24 hours 11/09/21 2037 11/09/21 2040   11/09/21 1400  ceFAZolin (ANCEF) IVPB 2g/100 mL premix  Status:  Discontinued        2 g 200 mL/hr over 30 Minutes Intravenous Every 8 hours 11/09/21 1036 11/09/21 2037   11/09/21 1330  emtricitabine-tenofovir AF (DESCOVY) 200-25 MG per tablet 1 tablet        1 tablet Oral Daily 11/09/21 0849     11/09/21 1330  raltegravir (ISENTRESS) tablet 400 mg        400 mg Oral 2 times daily 11/09/21 0849     11/08/21 2330  ceFAZolin (ANCEF) IVPB 1 g/50 mL premix  Status:  Discontinued        1 g 100 mL/hr over 30 Minutes Intravenous  Once 11/08/21 2328 11/09/21 1036        I have personally reviewed the following labs and images: CBC: Recent Labs  Lab 11/08/21 1917 11/09/21 0635 11/10/21 0456  WBC 6.0 5.4 4.6  HGB 14.4 13.5 12.9  HCT 44.7 42.1 40.1   MCV 86.5 86.8 87.4  PLT 326 288 261   BMP &GFR Recent Labs  Lab 11/08/21 1917 11/10/21 0456  NA 138 137  K 4.0 4.1  CL 105 105  CO2 24 26  GLUCOSE 354* 313*  BUN 9 10  CREATININE 0.78 0.59  CALCIUM 9.4 8.9   Estimated Creatinine Clearance: 113.4 mL/min (by C-G formula based on SCr of 0.59 mg/dL). Liver & Pancreas: Recent Labs  Lab 11/10/21 0456  AST 18  ALT 15  ALKPHOS 81  BILITOT 0.5  PROT 6.9  ALBUMIN 3.2*   No results for input(s): "LIPASE", "AMYLASE" in the last 168 hours. No results for input(s): "AMMONIA" in the last 168 hours. Diabetic: No results for input(s): "HGBA1C" in the last 72 hours. Recent Labs  Lab 11/10/21 0803 11/10/21 1158  GLUCAP 297* 280*   Cardiac Enzymes: No results for input(s): "CKTOTAL", "CKMB", "CKMBINDEX", "TROPONINI" in the last 168 hours. No results for input(s): "PROBNP" in the last 8760 hours. Coagulation Profile: No results for input(s): "INR", "PROTIME" in the last 168 hours. Thyroid Function Tests: No results for input(s): "TSH", "T4TOTAL", "FREET4", "T3FREE", "THYROIDAB" in the last 72 hours. Lipid Profile: No results for input(s): "CHOL", "HDL", "LDLCALC", "TRIG", "CHOLHDL", "LDLDIRECT" in the last 72 hours. Anemia Panel: No results for input(s): "VITAMINB12", "FOLATE", "FERRITIN", "TIBC", "IRON", "RETICCTPCT" in the last 72 hours. Urine analysis:    Component Value Date/Time   COLORURINE YELLOW 05/14/2021 1613   APPEARANCEUR CLEAR 05/14/2021 1613   LABSPEC 1.027 05/14/2021 1613   PHURINE 5.0 05/14/2021 1613   GLUCOSEU >=500 (A) 05/14/2021 1613   HGBUR SMALL (A) 05/14/2021 1613   BILIRUBINUR NEGATIVE 05/14/2021 1613   KETONESUR NEGATIVE 05/14/2021 1613   PROTEINUR NEGATIVE 05/14/2021 1613   UROBILINOGEN 1.0 04/12/2009 1457   NITRITE NEGATIVE 05/14/2021 1613   LEUKOCYTESUR NEGATIVE 05/14/2021 1613   Sepsis Labs: Invalid input(s): "PROCALCITONIN", "LACTICIDVEN"  Microbiology: Recent Results (from the past 240  hour(s))  Culture, blood (routine x 2)     Status: None (Preliminary result)   Collection Time: 11/08/21 11:41 PM   Specimen: BLOOD  Result Value Ref Range Status   Specimen Description   Final    BLOOD LEFT ANTECUBITAL Performed at [facility], [address] [place], [place] [address]    Special Requests  Final    BOTTLES DRAWN AEROBIC AND ANAEROBIC Blood Culture adequate volume Performed at [facility], [address], [place], [place] [address]    Culture   Final    NO GROWTH 1 DAY Performed at [facility], [address] [place], [place] [address]    Report Status PENDING  Incomplete  Culture, blood (routine x 2)     Status: None (Preliminary result)   Collection Time: 11/09/21 12:10 AM   Specimen: BLOOD  Result Value Ref Range Status   Specimen Description   Final    BLOOD LEFT ANTECUBITAL Performed at [facility], [address] [place], [place] [address]    Special Requests   Final    BOTTLES DRAWN AEROBIC AND ANAEROBIC Blood Culture adequate volume Performed at [facility], [address], [place], [place] [address]    Culture   Final    NO GROWTH 1 DAY Performed at [facility], [address], [place], [place] [address]    Report Status PENDING  Incomplete  MRSA Next Gen by PCR, Nasal     Status: Abnormal   Collection Time: 11/09/21 10:38 AM   Specimen: Nasal Mucosa; Nasal Swab  Result Value Ref Range Status   MRSA by PCR Next Gen DETECTED (A) NOT DETECTED Final    Comment: (NOTE) The GeneXpert MRSA Assay (FDA approved for NASAL specimens only), is one component of a comprehensive MRSA colonization surveillance program. It is not intended to diagnose MRSA infection nor to guide or monitor treatment for MRSA infections. Test performance is not FDA approved in patients less than 63 years old. Performed at [facility], [address] [place], [place] [address]     Radiology Studies: No results found.    Manuela Halbur T. Ailea Rhatigan [facility]  If 7PM-7AM, please contact night-coverage [email or web address] 11/10/2021, 12:28 PM

## 2021-11-10 NOTE — Progress Notes (Signed)
Inpatient Diabetes Program Recommendations  AACE/ADA: New Consensus Statement on Inpatient Glycemic Control (2015)  Target Ranges:  Prepandial:   less than 140 mg/dL      Peak postprandial:   less than 180 mg/dL (1-2 hours)      Critically ill patients:  140 - 180 mg/dL   Lab Results  Component Value Date   GLUCAP 297 (H) 11/10/2021    Review of Glycemic Control  Diabetes history: DM2 Outpatient Diabetes medications: metformin 500 mg QD Current orders for Inpatient glycemic control: Novolog 0-15 TID with meals and 0-5 HS  Inpatient Diabetes Program Recommendations:    Consider adding Semglee 15 units QD Add meal coverage 4 units TID with meals if eating > 50%  HgbA1C pending. Will likely need insulin at discharge.  Will see pt to speak with her about her diabetes and importance of tight control to assist with healing.  Continue to follow.  Thank you. Ailene Ards, RD, LDN, CDE Inpatient Diabetes Coordinator [phone]

## 2021-11-10 NOTE — Evaluation (Signed)
Physical Therapy Evaluation Patient Details Name: Lynn Bates MRN: [id number] DOB: [birth year] Today's Date: 11/10/2021  History of Present Illness  51 year old F with PMH of HOCM s/p PPM in 2009, uncontrolled NIDDM-2, morbid obesity, HTN, sleep apnea, PE and DVT s/p IVC filter in 2003, thyroid nodules and noncompliance with medication presenting 11/08/21 with left foot and ankle swelling, pain, subjective fever and chills for about a week, and admitted for left lower extremity cellulitis.  .  .  She says she is currently homeless staying at a motel with her 3 children  Clinical Impression  The patient is sleepy but able to ambulate in room using RW. Noted to be antalgic on LLE and reports pain. Patient may progress   to not need a RW as LLE improves. Pt admitted with above diagnosis.  Pt currently with functional limitations due to the deficits listed below (see PT Problem List). Pt will benefit from skilled PT to increase their independence and safety with mobility to allow discharge to the venue listed below.          Recommendations for follow up therapy are one component of a multi-disciplinary discharge planning process, led by the attending physician.  Recommendations may be updated based on patient status, additional functional criteria and insurance authorization.  Follow Up Recommendations No PT follow up      Assistance Recommended at Discharge PRN  Patient can return home with the following  Assistance with cooking/housework;Help with stairs or ramp for entrance;Assist for transportation    Equipment Recommendations Rolling walker (2 wheels)  Recommendations for Other Services       Functional Status Assessment Patient has had a recent decline in their functional status and demonstrates the ability to make significant improvements in function in a reasonable and predictable amount of time.     Precautions / Restrictions Precautions Precautions: Fall      Mobility  Bed  Mobility Overal bed mobility: Modified Independent                  Transfers Overall transfer level: Needs assistance   Transfers: Sit to/from Stand Sit to Stand: Supervision                Ambulation/Gait Ambulation/Gait assistance: Min guard Gait Distance (Feet): 25 Feet Assistive device: Rolling walker (2 wheels) Gait Pattern/deviations: Step-to pattern, Antalgic, Decreased stance time - left Gait velocity: decr     General Gait Details: Patient moves slowly, appears left knee slightly buckles with WB  Stairs            Wheelchair Mobility    Modified Rankin (Stroke Patients Only)       Balance Overall balance assessment: Needs assistance Sitting-balance support: No upper extremity supported, Feet supported Sitting balance-Leahy Scale: Normal     Standing balance support: During functional activity, Bilateral upper extremity supported, Reliant on assistive device for balance Standing balance-Leahy Scale: Fair Standing balance comment: due to LLE pain, needs support                             Pertinent Vitals/Pain Pain Assessment Pain Assessment: 0-10 Pain Score: 5  Pain Location: Left leg Pain Descriptors / Indicators: Aching Pain Intervention(s): Monitored during session, Patient requesting pain meds-RN notified    Home Living Family/patient expects to be discharged to:: Shelter/Homeless                   Additional Comments: currently in a  motel , then back to her car with 3 children    Prior Function Prior Level of Function : Independent/Modified Independent                     Hand Dominance   Dominant Hand: Right    Extremity/Trunk Assessment   Upper Extremity Assessment Upper Extremity Assessment: Overall WFL for tasks assessed    Lower Extremity Assessment Lower Extremity Assessment: LLE deficits/detail LLE Deficits / Details: antalgic to WB, moves leg WFL    Cervical / Trunk  Assessment Cervical / Trunk Assessment: Normal  Communication   Communication: No difficulties  Cognition Arousal/Alertness: Awake/alert Behavior During Therapy: WFL for tasks assessed/performed Overall Cognitive Status: Within Functional Limits for tasks assessed                                          General Comments      Exercises     Assessment/Plan    PT Assessment Patient needs continued PT services  PT Problem List Decreased strength;Decreased mobility;Decreased safety awareness;Decreased activity tolerance;Pain       PT Treatment Interventions DME instruction;Therapeutic activities;Gait training;Therapeutic exercise;Patient/family education;Stair training;Functional mobility training    PT Goals (Current goals can be found in the Care Plan section)  Acute Rehab PT Goals Patient Stated Goal: to find a place to live PT Goal Formulation: With patient Time For Goal Achievement: 11/24/21 Potential to Achieve Goals: Good    Frequency Min 3X/week     Co-evaluation               AM-PAC PT "6 Clicks" Mobility  Outcome Measure Help needed turning from your back to your side while in a flat bed without using bedrails?: None Help needed moving from lying on your back to sitting on the side of a flat bed without using bedrails?: None Help needed moving to and from a bed to a chair (including a wheelchair)?: A Little Help needed standing up from a chair using your arms (e.g., wheelchair or bedside chair)?: A Little Help needed to walk in hospital room?: A Little Help needed climbing 3-5 steps with a railing? : A Lot 6 Click Score: 19    End of Session   Activity Tolerance: Patient limited by fatigue;Patient limited by pain Patient left: in bed;with call bell/phone within reach Nurse Communication: Mobility status PT Visit Diagnosis: Unsteadiness on feet (R26.81);Pain Pain - Right/Left: Left Pain - part of body: Leg    Time: 1410-1427 PT  Time Calculation (min) (ACUTE ONLY): 17 min   Charges:   PT Evaluation $PT Eval Low Complexity: 1 Low          Blanchard Kelch PT Acute Rehabilitation Services Office [phone] Weekend pager-[phone]   Rada Hay 11/10/2021, 3:40 PM

## 2021-11-11 ENCOUNTER — Other Ambulatory Visit (HOSPITAL_COMMUNITY): Payer: Self-pay

## 2021-11-11 ENCOUNTER — Other Ambulatory Visit: Payer: Self-pay

## 2021-11-11 ENCOUNTER — Encounter (HOSPITAL_COMMUNITY): Payer: Self-pay | Admitting: Internal Medicine

## 2021-11-11 DIAGNOSIS — R778 Other specified abnormalities of plasma proteins: Secondary | ICD-10-CM | POA: Diagnosis not present

## 2021-11-11 DIAGNOSIS — I82531 Chronic embolism and thrombosis of right popliteal vein: Secondary | ICD-10-CM | POA: Diagnosis not present

## 2021-11-11 DIAGNOSIS — Z91199 Patient's noncompliance with other medical treatment and regimen due to unspecified reason: Secondary | ICD-10-CM

## 2021-11-11 DIAGNOSIS — I10 Essential (primary) hypertension: Secondary | ICD-10-CM | POA: Diagnosis not present

## 2021-11-11 DIAGNOSIS — I2782 Chronic pulmonary embolism: Secondary | ICD-10-CM

## 2021-11-11 DIAGNOSIS — L03116 Cellulitis of left lower limb: Secondary | ICD-10-CM | POA: Diagnosis not present

## 2021-11-11 LAB — HIV-1 RNA QUANT-NO REFLEX-BLD
HIV 1 RNA Quant: 2950 copies/mL
LOG10 HIV-1 RNA: 3.47 log10copy/mL

## 2021-11-11 LAB — LIPID PANEL
Cholesterol: 229 mg/dL — ABNORMAL HIGH (ref 0–200)
HDL: 32 mg/dL — ABNORMAL LOW (ref >40)
LDL Cholesterol: 163 mg/dL — ABNORMAL HIGH (ref 0–99)
Total CHOL/HDL Ratio: 7.2 RATIO
Triglycerides: 172 mg/dL — ABNORMAL HIGH (ref <150)
VLDL: 34 mg/dL (ref 0–40)

## 2021-11-11 LAB — CBC
HCT: 44.2 % (ref 36.0–46.0)
Hemoglobin: 14.1 g/dL (ref 12.0–15.0)
MCH: 27.9 pg (ref 26.0–34.0)
MCHC: 31.9 g/dL (ref 30.0–36.0)
MCV: 87.5 fL (ref 80.0–100.0)
Platelets: 275 10*3/uL (ref 150–400)
RBC: 5.05 MIL/uL (ref 3.87–5.11)
RDW: 12.6 % (ref 11.5–15.5)
WBC: 5 10*3/uL (ref 4.0–10.5)
nRBC: 0 % (ref 0.0–0.2)

## 2021-11-11 LAB — GLUCOSE, CAPILLARY
Glucose-Capillary: 221 mg/dL — ABNORMAL HIGH (ref 70–99)
Glucose-Capillary: 252 mg/dL — ABNORMAL HIGH (ref 70–99)

## 2021-11-11 LAB — MAGNESIUM: Magnesium: 2 mg/dL (ref 1.7–2.4)

## 2021-11-11 LAB — RENAL FUNCTION PANEL
Albumin: 3.5 g/dL (ref 3.5–5.0)
Anion gap: 8 (ref 5–15)
BUN: 10 mg/dL (ref 6–20)
CO2: 24 mmol/L (ref 22–32)
Calcium: 8.8 mg/dL — ABNORMAL LOW (ref 8.9–10.3)
Chloride: 101 mmol/L (ref 98–111)
Creatinine, Ser: 0.52 mg/dL (ref 0.44–1.00)
GFR, Estimated: >60 mL/min (ref >60)
Glucose, Bld: 266 mg/dL — ABNORMAL HIGH (ref 70–99)
Phosphorus: 3.2 mg/dL (ref 2.5–4.6)
Potassium: 4 mmol/L (ref 3.5–5.1)
Sodium: 133 mmol/L — ABNORMAL LOW (ref 135–145)

## 2021-11-11 LAB — CK: Total CK: 24 U/L — ABNORMAL LOW (ref 38–234)

## 2021-11-11 LAB — HEMOGLOBIN A1C
Hgb A1c MFr Bld: 10.2 % — ABNORMAL HIGH (ref 4.8–5.6)
Mean Plasma Glucose: 246 mg/dL

## 2021-11-11 MED ORDER — DOXYCYCLINE HYCLATE 100 MG PO CAPS
100.0000 mg | ORAL_CAPSULE | Freq: Two times a day (BID) | ORAL | 0 refills | Status: AC
  Filled 2021-11-11: qty 16, 8d supply, fill #0

## 2021-11-11 MED ORDER — METFORMIN HCL 1000 MG PO TABS
1000.0000 mg | ORAL_TABLET | Freq: Two times a day (BID) | ORAL | 1 refills | Status: AC
Start: 2021-11-11 — End: ?
  Filled 2021-11-11: qty 60, 30d supply, fill #0

## 2021-11-11 MED ORDER — ATORVASTATIN CALCIUM 20 MG PO TABS: 20.0000 mg | ORAL_TABLET | Freq: Every day | ORAL | 11 refills | Status: AC

## 2021-11-11 MED ORDER — INSULIN ASPART 100 UNIT/ML IJ SOLN
0.0000 [IU] | Freq: Three times a day (TID) | INTRAMUSCULAR | Status: DC
  Administered 2021-11-11: 8 [IU] via SUBCUTANEOUS

## 2021-11-11 MED ORDER — SEMAGLUTIDE (1 MG/DOSE) 4 MG/3ML ~~LOC~~ SOPN
1.0000 mg | PEN_INJECTOR | SUBCUTANEOUS | 1 refills | Status: AC
  Filled 2021-11-11: qty 3, 28d supply, fill #0

## 2021-11-11 MED ORDER — GABAPENTIN 300 MG PO CAPS
300.0000 mg | ORAL_CAPSULE | Freq: Every day | ORAL | 1 refills | Status: AC
  Filled 2021-11-11: qty 30, 30d supply, fill #0

## 2021-11-11 MED ORDER — APIXABAN 5 MG PO TABS
5.0000 mg | ORAL_TABLET | Freq: Two times a day (BID) | ORAL | 1 refills | Status: AC
  Filled 2021-11-11: qty 60, 30d supply, fill #0

## 2021-11-11 MED ORDER — LOSARTAN POTASSIUM-HCTZ 50-12.5 MG PO TABS
1.0000 | ORAL_TABLET | Freq: Every day | ORAL | 1 refills | Status: AC
  Filled 2021-11-11: qty 30, 30d supply, fill #0

## 2021-11-11 MED ORDER — INSULIN ASPART 100 UNIT/ML IJ SOLN
4.0000 [IU] | Freq: Three times a day (TID) | INTRAMUSCULAR | Status: DC
  Administered 2021-11-11: 4 [IU] via SUBCUTANEOUS

## 2021-11-11 MED ORDER — INSULIN ASPART 100 UNIT/ML IJ SOLN: 0.0000 [IU] | Freq: Every day | INTRAMUSCULAR | Status: DC

## 2021-11-11 MED ORDER — INSULIN GLARGINE-YFGN 100 UNIT/ML ~~LOC~~ SOLN
10.0000 [IU] | Freq: Two times a day (BID) | SUBCUTANEOUS | Status: DC
  Administered 2021-11-11: 10 [IU] via SUBCUTANEOUS
  Filled 2021-11-11 (×2): qty 0.1

## 2021-11-11 NOTE — TOC Transition Note (Signed)
Transition of Care [facility]) - CM/SW Discharge Note   Patient Details  Name: [name] MRN: [id number] Date of Birth: 06-11-1970  Transition [facility] CM/SW Contact:  Otelia Santee, LCSW Phone Number: 11/11/2021, 10:49 AM   Clinical Narrative:    Pt is to have RW delivered to room by [employer]. No further TOC needs identified at this time.      Barriers to Discharge: Financial Resources, Homeless with medical needs, Continued Medical Work up   Patient Goals and CMS Choice Patient states their goals for this hospitalization and ongoing recovery are:: To return home with kids   Choice offered to / list presented to : Patient  Discharge Placement                       Discharge Plan and Services In-house Referral: Clinical Social Work Discharge Planning Services: CM Consult            DME Arranged: Dan Humphreys rolling DME Agency: [employer] Date DME Agency Contacted: 11/11/21 Time DME Agency Contacted: 1049 Representative spoke with at DME Agency: Duwayne Heck            Social Determinants of Health (SDOH) Interventions     Readmission Risk Interventions    11/10/2021    9:58 AM  Readmission Risk Prevention Plan  Post Dischage Appt Complete  Medication Screening Complete  Transportation Screening Complete

## 2021-11-11 NOTE — Discharge Summary (Signed)
Physician Discharge Summary  Lynn Bates UUV:[id number] DOB: 09-28-1970 DOA: 11/08/2021  PCP: [facility]  Admit date: 11/08/2021 Discharge date: 11/11/2021 Admitted From: Motel Disposition: Motel Recommendations for Outpatient Follow-up:  Follow up with PCP in 1 week Please obtain BMP and CBC at follow-up Reassess diabetic control and blood pressure at follow-up Please follow up on the following pending results: HIV-1 quantitative  Home Health: Not indicated Equipment/Devices: Rolling walker  Discharge Condition: Stable CODE STATUS: Full code  Follow-up Information     [facility]. Schedule an appointment as soon as possible for a visit in 1 week(s).   Contact information: [address] [place] [address] [phone]                 Hospital course 51 year old F with PMH of HOCM s/p PPM in 2009, uncontrolled NIDDM-2, morbid obesity, HTN, sleep apnea, PE and DVT s/p IVC filter in 2003, thyroid nodules and noncompliance with medication presenting with left foot and ankle swelling, pain, subjective fever and chills for about a week, and admitted for left lower extremity cellulitis.  She was noncompliant with medications.  Reports difficulty affording the co-pays.  She says she is currently homeless staying at a motel with her son.   In ED, vital stable.  Afebrile.  Glucose 354.  BNP 95.  CBC normal.  Troponin 93> 108.  Twelve-lead EKG sinus rhythm with slight bilateral atrial enlargement.  Lactic acid negative.  CXR with enlargement of cardiac silhouette with vascular congestion.  CTA chest negative for PE but suggestive for pulm hypertension and nonspecific enlarged right hilar and interlobular lymph nodes.  LE venous Doppler chronic deep vein thrombosis involving the left popliteal vein, and left gastrocnemius veins.   Patient was continued on IV vancomycin.  Cellulitis improved.  She was discharged on p.o. doxycycline.  Counseled on the  importance of good glycemic control and compliance with her medication.  Also encouraged leg elevation and to follow-up with PCP in 1 week.  We have discontinued her amlodipine to reduce the risk of leg edema.  We have changed Lasix to HCTZ for p.o. antihypertensive and diuretic effect.  We feel most of her essential prescriptions at [facility] outpatient pharmacy and delivered to bedside prior to discharge.  Patient was evaluated by therapy.  Rolling walker ordered on discharge.  See individual problem list below for more.   Problems addressed during this hospitalization Principal Problem:   Cellulitis and abscess of left lower extremity Active Problems:   Chronic deep vein thrombosis (DVT) of popliteal vein of right lower extremity (HCC)   Essential hypertension   Gastroesophageal reflux disease without esophagitis   History of pulmonary embolus (PE)   Hyperlipidemia due to type 2 diabetes mellitus (HCC)   Human immunodeficiency virus (HIV) disease (HCC)   Major depressive disorder, recurrent episode (HCC)   Morbid (severe) obesity due to excess calories (HCC)   Obstructive hypertrophic cardiomyopathy (HCC)   Pulmonary embolism (HCC)   Sleep apnea   Type 2 diabetes mellitus, without long-term current use of insulin (HCC)   Elevated troponin    Nonpurulent LLE cellulitis in patient with HIV and uncontrolled diabetes.  Presents with LLE swelling, pain, subjective fever and chills.  MRSA PCR and blood cultures NGTD.  She has good DP pulses bilaterally.  Venous Doppler showed chronic LLE DVT could also contribute to her leg swelling.  -Received IV vancomycin for 2 days in house.  Discharged on p.o. doxycycline for 8 days -Discontinued amlodipine  and started HCTZ due to edema. -Emphasized the importance of glycemic control and leg elevation -Started gabapentin at night for possible neuropathic pain   History of pulmonary embolism-CTA chest negative for PE. Chronic LLE DVT-LE venous  Doppler with chronic distal DVT History of IVC filter in 2003 -Resume home p.o. Eliquis.  Renewed prescription   Chronic diastolic CHF/HOCM s/p PPM in 2009: No cardiopulmonary symptoms. -Change Lasix to HCTZ -Discontinued amlodipine   Elevated troponin: Likely demand ischemia.  No cardiopulmonary symptoms. -No further work-up   History of asthma: Stable History of sleep apnea-not on CPAP -Recommend outpatient referral to sleep clinic   HIV: CD4 count 579. -Continue home medications -Follow HIV-1 quantitative -Outpatient follow-up with ID   Essential hypertension: Relatively stable. -Hold amlodipine 10 mg -Continue other cardiac meds   Uncontrolled NIDDM-2 with hyperglycemia: A1c 10.2% (was 9.4% in 07/2021).  Likely due to noncompliance. -Renewed the prescription for Ozempic -Increase metformin to 1000 mg twice daily -Change atorvastatin to Lipitor -Counseled on lifestyle change including diet and exercise.  Resources provided.  Homelessness-lives in motel. -TOC consulted and provided resources   History of depression: Stable. -Continue home medication   Morbid obesity Body mass index is 46.93 kg/m. -Refilled Ozempic -Encourage lifestyle change            Vital signs Vitals:   11/10/21 1359 11/10/21 2128 11/11/21 0600 11/11/21 1251  BP: 125/64 (!) 150/85 134/86 (!) 147/84  Pulse: 66 82 68 77  Temp: 97.7 F (36.5 C) (!) 97.3 F (36.3 C) 98.4 F (36.9 C) 98.1 F (36.7 C)  Resp: 18 20 20 20   Height:      Weight:      SpO2: 93% 100% 98% 100%  TempSrc: Oral Oral Oral Oral  BMI (Calculated):         Discharge exam  GENERAL: No apparent distress.  Nontoxic. HEENT: MMM.  Vision and hearing grossly intact.  NECK: Supple.  No apparent JVD.  RESP:  No IWOB.  Fair aeration bilaterally. CVS:  RRR. Heart sounds normal.  ABD/GI/GU: BS+. Abd soft, NTND.  MSK/EXT:  Moves extremities. No apparent deformity.  LLE edema. SKIN: no apparent skin lesion or  wound NEURO: Awake and alert. Oriented appropriately.  No apparent focal neuro deficit. PSYCH: Calm. Normal affect.   Discharge Instructions Discharge Instructions     Call MD for:  difficulty breathing, headache or visual disturbances   Complete by: As directed    Call MD for:  extreme fatigue   Complete by: As directed    Call MD for:  persistant dizziness or light-headedness   Complete by: As directed    Call MD for:  redness, tenderness, or signs of infection (pain, swelling, redness, odor or green/yellow discharge around incision site)   Complete by: As directed    Call MD for:  severe uncontrolled pain   Complete by: As directed    Call MD for:  temperature >100.4   Complete by: As directed    Diet - low sodium heart healthy   Complete by: As directed    Diet Carb Modified   Complete by: As directed    Discharge instructions   Complete by: As directed    It has been a pleasure taking care of you!  You were hospitalized due to left leg swelling and pain.  You have been started on antibiotic for possible infection.  We are discharging you on more antibiotics to complete treatment course.  We also recommend elevating your leg whenever  you are seated.  It is very important that you get you diabetes under good control.  We have renewed your Ozempic and increase your metformin.  We have also made some changes to your blood pressure medications. Please review your new medication list and the directions on your medications before you take them.  [facility] separate instruction for diabetes and diet and exercise.  Follow-up with your primary care doctor in 1 to 2 weeks or sooner if needed.   Take care,   Increase activity slowly   Complete by: As directed    No wound care   Complete by: As directed       Allergies as of 11/11/2021   No Known Allergies      Medication List     STOP taking these medications    amLODipine 10 MG tablet Commonly known as: NORVASC   furosemide  20 MG tablet Commonly known as: LASIX   losartan 25 MG tablet Commonly known as: COZAAR   pravastatin 20 MG tablet Commonly known as: PRAVACHOL       TAKE these medications    acetaminophen 500 MG tablet Commonly known as: TYLENOL Take 1,000 mg by mouth every 6 (six) hours as needed for mild pain.   atorvastatin 20 MG tablet Commonly known as: Lipitor Take 1 tablet (20 mg total) by mouth daily.   Descovy 200-25 MG tablet Generic drug: emtricitabine-tenofovir AF Take 1 tablet by mouth daily.   doxycycline 100 MG capsule Commonly known as: VIBRAMYCIN Take 1 capsule (100 mg total) by mouth 2 (two) times daily for 8 days.   Eliquis 5 MG Tabs tablet Generic drug: apixaban Take 1 tablet (5 mg total) by mouth in the morning and at bedtime.   fluticasone 50 MCG/ACT nasal spray Commonly known as: FLONASE two sprays by Both Nostrils route daily.   gabapentin 300 MG capsule Commonly known as: Neurontin Take 1 capsule (300 mg total) by mouth at bedtime.   Isentress 400 MG tablet Generic drug: raltegravir Take 400 mg by mouth in the morning and at bedtime.   latanoprost 0.005 % ophthalmic solution Commonly known as: XALATAN Place 1 drop into both eyes at bedtime.   linaclotide 145 MCG Caps capsule Commonly known as: LINZESS Take 145 mcg by mouth daily.   losartan-hydrochlorothiazide 50-12.5 MG tablet Commonly known as: Hyzaar Take 1 tablet by mouth daily.   meclizine 25 MG tablet Commonly known as: ANTIVERT Take 1 tablet (25 mg total) by mouth 3 (three) times daily as needed for dizziness.   metFORMIN 1000 MG tablet Commonly known as: GLUCOPHAGE Take 1 tablet (1,000 mg total) by mouth 2 (two) times daily with a meal. What changed:  medication strength how much to take when to take this   metoprolol tartrate 50 MG tablet Commonly known as: LOPRESSOR Take 50 mg by mouth in the morning and at bedtime.   montelukast 10 MG tablet Commonly known as:  SINGULAIR Take by mouth.   Ozempic (1 MG/DOSE) 4 MG/3ML Sopn Generic drug: Semaglutide (1 MG/DOSE) Inject 1 mg into the skin once a week.   pantoprazole 40 MG tablet Commonly known as: PROTONIX Take 40 mg by mouth daily.   risperiDONE 2 MG tablet Commonly known as: RISPERDAL Take 1 tablet by mouth daily.   Tiotropium Bromide Monohydrate 2.5 MCG/ACT Aers Inhale 2 puffs into the lungs daily.   triamcinolone cream 0.1 % Commonly known as: KENALOG Apply 1 application topically in the morning and at bedtime.   valACYclovir 500  MG tablet Commonly known as: VALTREX Take by mouth.               Durable Medical Equipment  (From admission, onward)           Start     Ordered   11/11/21 1136  For home use only DME Walker rolling  Once       Question Answer Comment  Walker: With 5 Inch Wheels   Patient needs a walker to treat with the following condition Left leg pain      11/11/21 1136            Consultations: None  Procedures/Studies:   VAS [place] LOWER EXTREMITY VENOUS (DVT) 7a-7p  Result Date: 11/09/2021  Lower Venous DVT Study Patient Name:  Lynn Bates  Date of Exam:   11/09/2021 Medical Rec #: [id number]     Accession #:    [id number] Date of Birth: Jan 26, 1971     Patient Gender: F Patient Age:   28 years Exam Location:  [facility] Procedure:      VAS [place] LOWER EXTREMITY VENOUS (DVT) Referring Phys: [facility] PFEIFFER --------------------------------------------------------------------------------  Indications: Swelling.  Risk Factors: None identified. Limitations: Poor ultrasound/tissue interface and open wound. Comparison Study: No prior studies. Performing Technologist: Chanda Busing RVT  Examination Guidelines: A complete evaluation includes B-mode imaging, spectral Doppler, color Doppler, and power Doppler as needed of all accessible portions of each vessel. Bilateral testing is considered an integral part of a complete examination. Limited  examinations for reoccurring indications may be performed as noted. The reflux portion of the exam is performed with the patient in reverse Trendelenburg.  +-----+---------------+---------+-----------+----------+--------------+ RIGHTCompressibilityPhasicitySpontaneityPropertiesThrombus Aging +-----+---------------+---------+-----------+----------+--------------+ CFV  Full           Yes      Yes                                 +-----+---------------+---------+-----------+----------+--------------+   +---------+---------------+---------+-----------+----------+--------------+ LEFT     CompressibilityPhasicitySpontaneityPropertiesThrombus Aging +---------+---------------+---------+-----------+----------+--------------+ CFV      Full           Yes      Yes                                 +---------+---------------+---------+-----------+----------+--------------+ SFJ      Full                                                        +---------+---------------+---------+-----------+----------+--------------+ FV Prox  Full                                                        +---------+---------------+---------+-----------+----------+--------------+ FV Mid   Full                                                        +---------+---------------+---------+-----------+----------+--------------+ FV DistalFull                                                        +---------+---------------+---------+-----------+----------+--------------+  PFV      Full                                                        +---------+---------------+---------+-----------+----------+--------------+ POP      Partial        Yes      Yes                  Chronic        +---------+---------------+---------+-----------+----------+--------------+ PTV      Full                                                         +---------+---------------+---------+-----------+----------+--------------+ PERO     Full                                                        +---------+---------------+---------+-----------+----------+--------------+ Gastroc  Partial                                      Chronic        +---------+---------------+---------+-----------+----------+--------------+    Summary: RIGHT: - No evidence of common femoral vein obstruction.  LEFT: - Findings consistent with chronic deep vein thrombosis involving the left popliteal vein, and left gastrocnemius veins. - No cystic structure found in the popliteal fossa.  *See table(s) above for measurements and observations. Electronically signed by Gerarda FractionJoshua Robins on 11/09/2021 at 5:21:43 PM.    Final    CT Angio Chest PE W/Cm &/Or Wo Cm  Result Date: 11/08/2021 CLINICAL DATA:  C/o bilateral lower extremity swelling and edema. Pulmonary embolism (PE) suspected, high prob EXAM: CT ANGIOGRAPHY CHEST WITH CONTRAST TECHNIQUE: Multidetector CT imaging of the chest was performed using the standard protocol during bolus administration of intravenous contrast. Multiplanar CT image reconstructions and MIPs were obtained to evaluate the vascular anatomy. RADIATION DOSE REDUCTION: This exam was performed according to the departmental dose-optimization program which includes automated exposure control, adjustment of the mA and/or kV according to patient size and/or use of iterative reconstruction technique. CONTRAST:  100mL OMNIPAQUE IOHEXOL 350 MG/ML SOLN COMPARISON:  None Available. FINDINGS: Cardiovascular: 2 lead cardiac pacemaker partially visualized in grossly appropriate position. Satisfactory opacification of the pulmonary arteries to the segmental level. No evidence of pulmonary embolism. The main pulmonary artery is enlarged in caliber measuring up to 3.4 cm. Calcifications are noted within the left lower lobe and right lower lobe pulmonary arteries. Limited  evaluation of the subsegmental level to artifact. Normal heart size. No significant pericardial effusion. The thoracic aorta is normal in caliber. No atherosclerotic plaque of the thoracic aorta. No coronary artery calcifications. Mediastinum/Nodes: Enlarged right hilar and interlobular lymph nodes (5:109, 148) with as an example a 1.3 cm node. No mediastinal or left hilar or axillary lymph nodes. Thyroid gland, trachea, and esophagus demonstrate no significant findings. Lungs/Pleura: No focal consolidation. No pulmonary nodule. No pulmonary mass. No pleural effusion.  No pneumothorax. Upper Abdomen: No acute abnormality. Musculoskeletal: No chest wall abnormality. No suspicious lytic or blastic osseous lesions. No acute displaced fracture. Multilevel degenerative changes of the spine. Review of the MIP images confirms the above findings. IMPRESSION: 1. No central or segmental pulmonary embolus. 2. Main pulmonary artery enlargement as well as pulmonary artery calcifications suggestive of pulmonary hypertension. 3. Nonspecific enlarged right hilar and interlobular lymph nodes. Recommend attention on follow-up. Electronically Signed   By: Tish Frederickson M.D.   On: 11/08/2021 23:04   DG Chest 2 View  Result Date: 11/08/2021 CLINICAL DATA:  BILATERAL lower extremity swelling and edema, shortness of breath EXAM: CHEST - 2 VIEW COMPARISON:  07/21/2009; interval exam is not available for comparison FINDINGS: LEFT subclavian pacemaker with leads projecting at RIGHT atrium and RIGHT ventricle. Enlargement of cardiac silhouette with pulmonary vascular congestion. Increased markings at lung bases likely related to superimposed soft tissue. No definite acute infiltrate, pleural effusion, or pneumothorax. Osseous structures unremarkable. IMPRESSION: Enlargement of cardiac silhouette with vascular congestion post pacemaker. No definite acute infiltrate. Electronically Signed   By: Ulyses Southward M.D.   On: 11/08/2021 19:01        The results of significant diagnostics from this hospitalization (including imaging, microbiology, ancillary and laboratory) are listed below for reference.     Microbiology: Recent Results (from the past 240 hour(s))  Culture, blood (routine x 2)     Status: None (Preliminary result)   Collection Time: 11/08/21 11:41 PM   Specimen: BLOOD  Result Value Ref Range Status   Specimen Description   Final    BLOOD LEFT ANTECUBITAL Performed at [facility], [address], [place], [place] [address]    Special Requests   Final    BOTTLES DRAWN AEROBIC AND ANAEROBIC Blood Culture adequate volume Performed at [facility], [address] [place], [place] [address]    Culture   Final    NO GROWTH 2 DAYS Performed at [facility], [address] [place], [place] [address]    Report Status PENDING  Incomplete  Culture, blood (routine x 2)     Status: None (Preliminary result)   Collection Time: 11/09/21 12:10 AM   Specimen: BLOOD  Result Value Ref Range Status   Specimen Description   Final    BLOOD LEFT ANTECUBITAL Performed at [facility], [address] [place], [place] [address]    Special Requests   Final    BOTTLES DRAWN AEROBIC AND ANAEROBIC Blood Culture adequate volume Performed at [facility], [address] [place], [place] [address]    Culture   Final    NO GROWTH 2 DAYS Performed at [facility], [address], [place], [place] [address]    Report Status PENDING  Incomplete  MRSA Next Gen by PCR, Nasal     Status: Abnormal   Collection Time: 11/09/21 10:38 AM   Specimen: Nasal Mucosa; Nasal Swab  Result Value Ref Range Status   MRSA by PCR Next Gen DETECTED (A) NOT DETECTED Final    Comment: (NOTE) The GeneXpert MRSA Assay (FDA approved for NASAL specimens only), is one component of a comprehensive MRSA colonization surveillance program. It is not intended to  diagnose MRSA infection nor to guide or monitor treatment for MRSA infections. Test performance is not FDA approved in patients less than 40 years old. Performed at [facility], [address], [place], [place] [address]  Labs:  CBC: Recent Labs  Lab 11/08/21 1917 11/09/21 0635 11/10/21 0456 11/11/21 0826  WBC 6.0 5.4 4.6 5.0  HGB 14.4 13.5 12.9 14.1  HCT 44.7 42.1 40.1 44.2  MCV 86.5 86.8 87.4 87.5  PLT 326 288 261 275   BMP &GFR Recent Labs  Lab 11/08/21 1917 11/10/21 0456 11/11/21 0826  NA 138 137 133*  K 4.0 4.1 4.0  CL 105 105 101  CO2 24 26 24   GLUCOSE 354* 313* 266*  BUN 9 10 10   CREATININE 0.78 0.59 0.52  CALCIUM 9.4 8.9 8.8*  MG  --   --  2.0  PHOS  --   --  3.2   Estimated Creatinine Clearance: 113.4 mL/min (by C-G formula based on SCr of 0.52 mg/dL). Liver & Pancreas: Recent Labs  Lab 11/10/21 0456 11/11/21 0826  AST 18  --   ALT 15  --   ALKPHOS 81  --   BILITOT 0.5  --   PROT 6.9  --   ALBUMIN 3.2* 3.5   No results for input(s): "LIPASE", "AMYLASE" in the last 168 hours. No results for input(s): "AMMONIA" in the last 168 hours. Diabetic: Recent Labs    11/10/21 0456  HGBA1C 10.2*   Recent Labs  Lab 11/10/21 1158 11/10/21 1642 11/10/21 2315 11/11/21 0736 11/11/21 1219  GLUCAP 280* 201* 308* 221* 252*   Cardiac Enzymes: Recent Labs  Lab 11/10/21 1300 11/11/21 0826  CKTOTAL 28* 24*   No results for input(s): "PROBNP" in the last 8760 hours. Coagulation Profile: No results for input(s): "INR", "PROTIME" in the last 168 hours. Thyroid Function Tests: No results for input(s): "TSH", "T4TOTAL", "FREET4", "T3FREE", "THYROIDAB" in the last 72 hours. Lipid Profile: Recent Labs    11/11/21 0826  CHOL 229*  HDL 32*  LDLCALC 163*  TRIG 172*  CHOLHDL 7.2   Anemia Panel: No results for input(s): "VITAMINB12", "FOLATE", "FERRITIN", "TIBC", "IRON", "RETICCTPCT" in the last 72 hours. Urine analysis:     Component Value Date/Time   COLORURINE YELLOW 05/14/2021 1613   APPEARANCEUR CLEAR 05/14/2021 1613   LABSPEC 1.027 05/14/2021 1613   PHURINE 5.0 05/14/2021 1613   GLUCOSEU >=500 (A) 05/14/2021 1613   HGBUR SMALL (A) 05/14/2021 1613   BILIRUBINUR NEGATIVE 05/14/2021 1613   KETONESUR NEGATIVE 05/14/2021 1613   PROTEINUR NEGATIVE 05/14/2021 1613   UROBILINOGEN 1.0 04/12/2009 1457   NITRITE NEGATIVE 05/14/2021 1613   LEUKOCYTESUR NEGATIVE 05/14/2021 1613   Sepsis Labs: Invalid input(s): "PROCALCITONIN", "LACTICIDVEN"   SIGNED:  05/16/2021, MD  [facility] 11/11/2021, 2:55 PM

## 2021-11-11 NOTE — Progress Notes (Signed)
Pt discharge home. AVS printed and educational teaching completed with teach back method. PIV's removed. Pt received medications at bedside from outpatient pharmacy. Pt received walker. Pt has all belongings. No further questions at this time.

## 2021-11-11 NOTE — Evaluation (Signed)
Occupational Therapy Evaluation Patient Details Name: Lynn Bates MRN: [id number] DOB: [birth year] Today's Date: 11/11/2021   History of Present Illness 51 year old F with PMH of HOCM s/p PPM in 2009, uncontrolled NIDDM-2, morbid obesity, HTN, sleep apnea, PE and DVT s/p IVC filter in 2003, thyroid nodules and noncompliance with medication presenting 11/08/21 with left foot and ankle swelling, pain, subjective fever and chills for about a week, and admitted for left lower extremity cellulitis.  .  .  She says she is currently homeless staying at a motel with her 3 children   Clinical Impression   Lynn Bates is a 51 year old woman who presents today with lower extremity pain. ON evaluation she is able to perform ADLs near her baseline but needing walker for ambulation. Recommend RW prior to discharge. Discussed needing to elevate feet, pump ankles and use of ice to reduce pain and swelling on left. She verbalizes understanding. No further OT needs.       Recommendations for follow up therapy are one component of a multi-disciplinary discharge planning process, led by the attending physician.  Recommendations may be updated based on patient status, additional functional criteria and insurance authorization.   Follow Up Recommendations  No OT follow up    Assistance Recommended at Discharge None  Patient can return home with the following Assist for transportation;Help with stairs or ramp for entrance    Functional Status Assessment  Patient has not had a recent decline in their functional status  Equipment Recommendations  Other (comment) (walker)    Recommendations for Other Services       Precautions / Restrictions Precautions Precautions: Fall Restrictions Weight Bearing Restrictions: No      Mobility Bed Mobility               General bed mobility comments: up in chair    Transfers Overall transfer level: Needs assistance   Transfers: Sit to/from Stand Sit  to Stand: Supervision           General transfer comment: requires use of walker to bear weight through due to LE pain      Balance Overall balance assessment: Mild deficits observed, not formally tested                                         ADL either performed or assessed with clinical judgement   ADL Overall ADL's : Modified independent                                       General ADL Comments: Increased time and with use of walker     Vision Patient Visual Report: No change from baseline       Perception     Praxis      Pertinent Vitals/Pain Pain Assessment Pain Assessment: 0-10 Pain Score: 5  Pain Location: Left leg Pain Descriptors / Indicators: Aching, Grimacing Pain Intervention(s): Monitored during session     Hand Dominance Right   Extremity/Trunk Assessment Upper Extremity Assessment Upper Extremity Assessment: Overall WFL for tasks assessed   Lower Extremity Assessment Lower Extremity Assessment: Overall WFL for tasks assessed LLE Deficits / Details: pain in LEs L > R   Cervical / Trunk Assessment Cervical / Trunk Assessment: Normal   Communication Communication Communication: No difficulties  Cognition Arousal/Alertness: Awake/alert Behavior During Therapy: WFL for tasks assessed/performed Overall Cognitive Status: Within Functional Limits for tasks assessed                                       General Comments       Exercises     Shoulder Instructions      Home Living Family/patient expects to be discharged to:: Shelter/Homeless                                 Additional Comments: currently in a motel , then back to her car with 3 children      Prior Functioning/Environment Prior Level of Function : Independent/Modified Independent                        OT Problem List: Pain      OT Treatment/Interventions:      OT Goals(Current goals can be  found in the care plan section) Acute Rehab OT Goals OT Goal Formulation: All assessment and education complete, DC therapy  OT Frequency:      Co-evaluation              AM-PAC OT "6 Clicks" Daily Activity     Outcome Measure Help from another person eating meals?: None Help from another person taking care of personal grooming?: None Help from another person toileting, which includes using toliet, bedpan, or urinal?: None Help from another person bathing (including washing, rinsing, drying)?: None Help from another person to put on and taking off regular upper body clothing?: None Help from another person to put on and taking off regular lower body clothing?: None 6 Click Score: 24   End of Session Equipment Utilized During Treatment: Rolling walker (2 wheels) Nurse Communication: Mobility status  Activity Tolerance: Patient tolerated treatment well Patient left: in chair;with call bell/phone within reach;with chair alarm set  OT Visit Diagnosis: Pain                Time: 9242-6834 OT Time Calculation (min): 8 min Charges:  OT General Charges $OT Visit: 1 Visit OT Evaluation $OT Eval Low Complexity: 1 Low  Thad Osoria, OTR/L Acute Care Rehab Services  Office [phone] Pager: [phone]   Kelli Churn 11/11/2021, 10:36 AM

## 2021-11-14 LAB — CULTURE, BLOOD (ROUTINE X 2)
Culture: NO GROWTH
Culture: NO GROWTH
Special Requests: ADEQUATE
Special Requests: ADEQUATE

## 2021-11-24 ENCOUNTER — Other Ambulatory Visit (HOSPITAL_BASED_OUTPATIENT_CLINIC_OR_DEPARTMENT_OTHER): Payer: Self-pay

## 2021-12-14 ENCOUNTER — Telehealth: Payer: Self-pay | Admitting: Emergency Medicine

## 2021-12-14 NOTE — Telephone Encounter (Signed)
Attempted call to discuss med refill request. Unable to leave vm.

## 2021-12-26 ENCOUNTER — Other Ambulatory Visit: Payer: Self-pay

## 2021-12-26 DIAGNOSIS — B009 Herpesviral infection, unspecified: Secondary | ICD-10-CM

## 2021-12-26 MED ORDER — VALACYCLOVIR HCL 500 MG PO TABS: 500.0000 mg | ORAL_TABLET | Freq: Every day | ORAL | 0 refills | Status: AC

## 2022-01-17 ENCOUNTER — Other Ambulatory Visit (HOSPITAL_COMMUNITY): Payer: Self-pay

## 2022-07-06 IMAGING — CT CT HEAD W/O CM
3 series · 16 of 47 positions shown, 19 images · non-contrast
Comparison: None.

CLINICAL DATA: Dizziness

EXAM:
CT HEAD WITHOUT CONTRAST
TECHNIQUE: Contiguous axial images were obtained from the base of the skull
through the vertex without intravenous contrast.

[Series 4: head wo · axial · 0.44mm/px · z∈[-182,-47]mm · 10 of 33 slices shown, 13 images]
[im 3/33  brain]
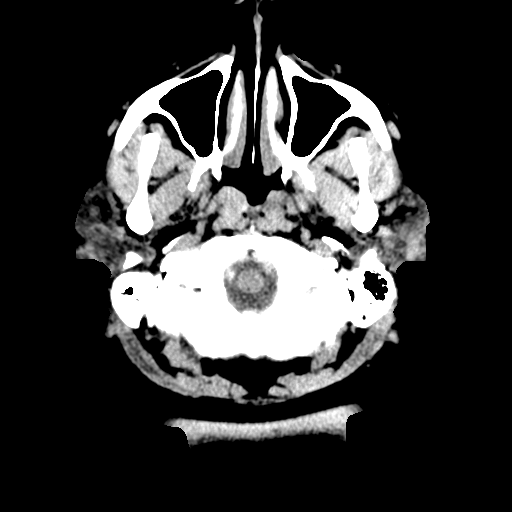
[im 3/33  bone]
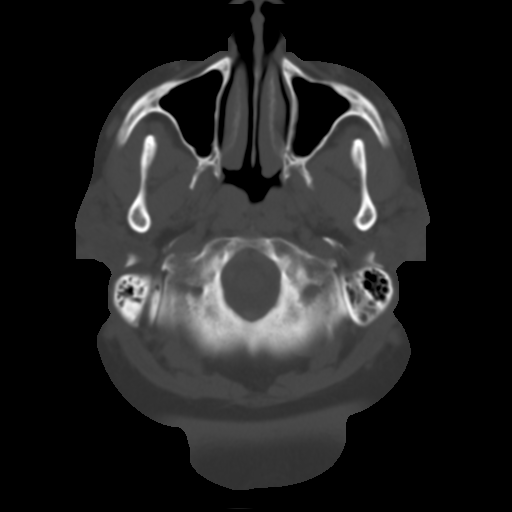
[im 6/33  brain]
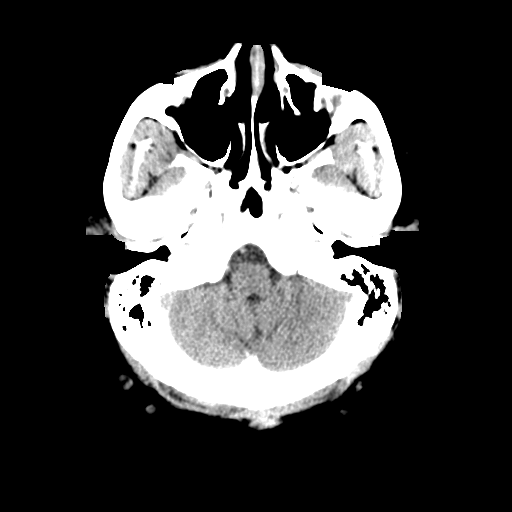
[im 9/33  brain]
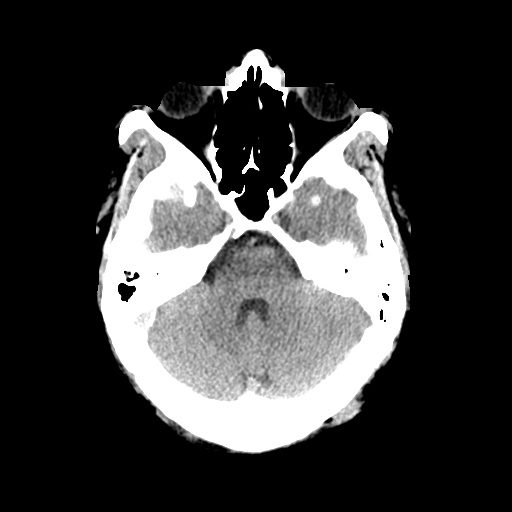
[im 12/33  brain]
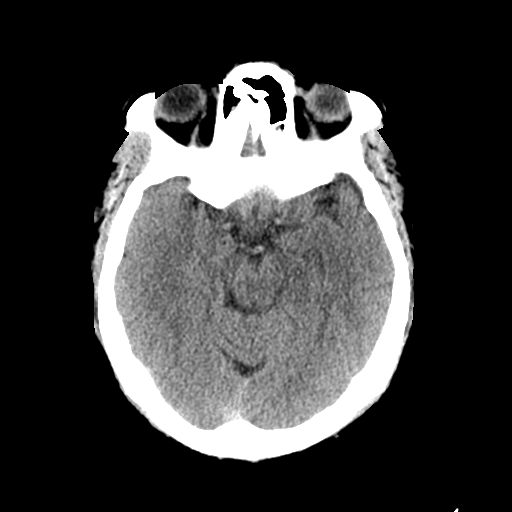
[im 15/33  brain]
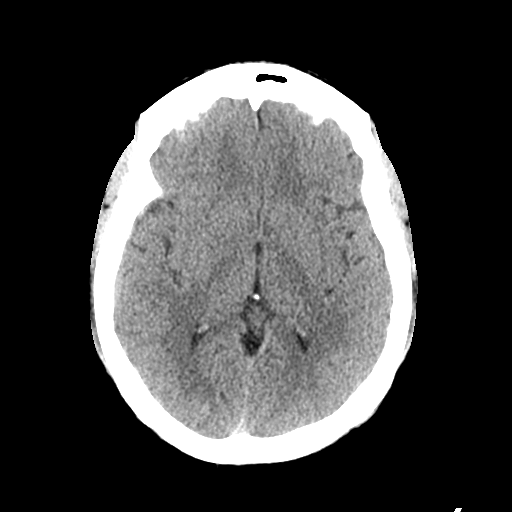
[im 15/33  bone]
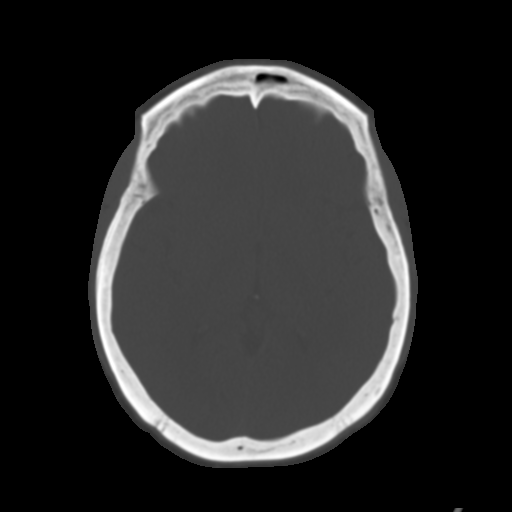
[im 18/33  brain]
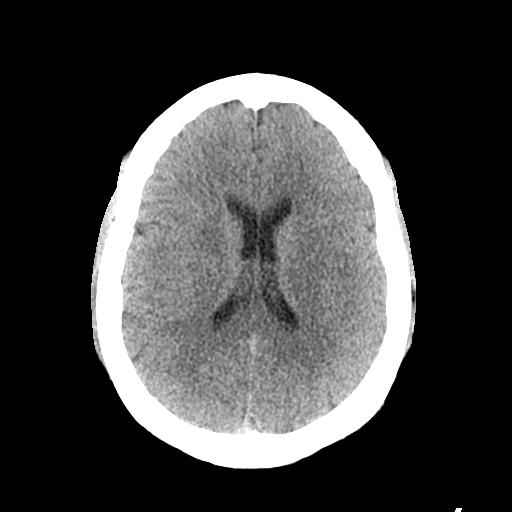
[im 21/33  brain]
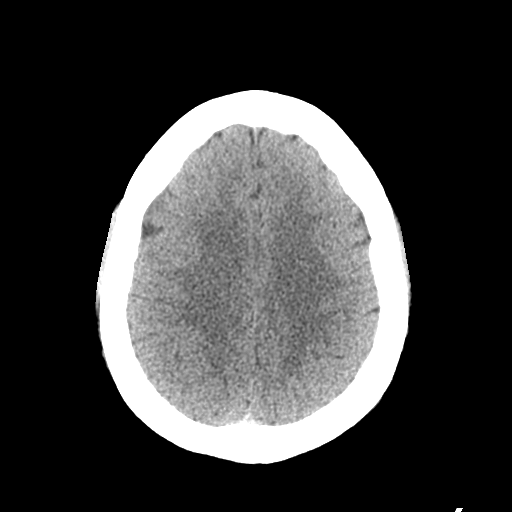
[im 25/33  brain]
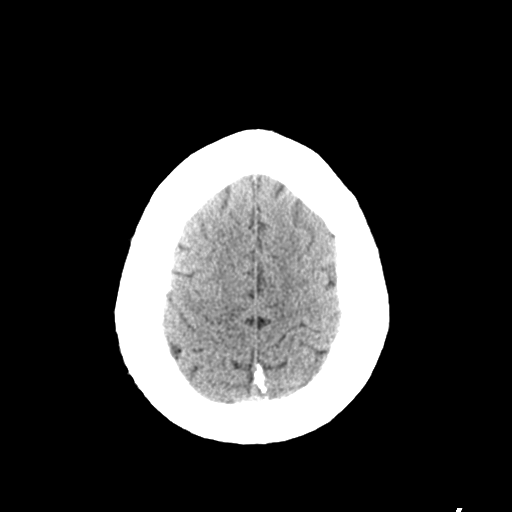
[im 27/33  brain]
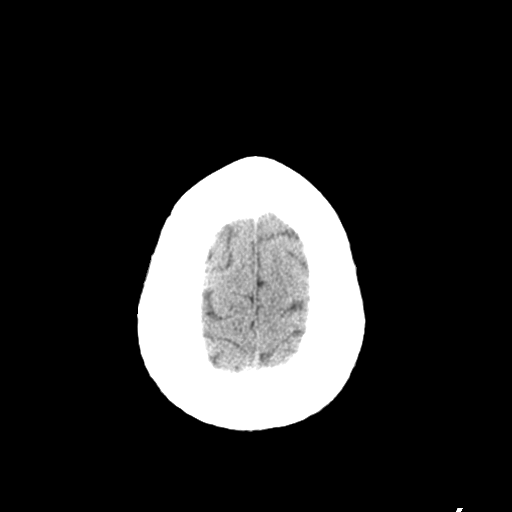
[im 27/33  bone]
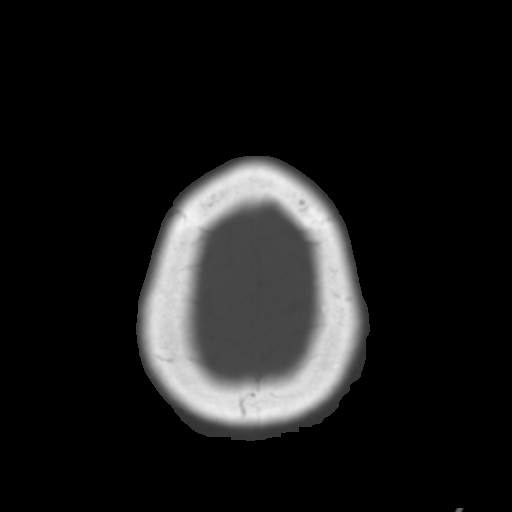
[im 30/33  brain]
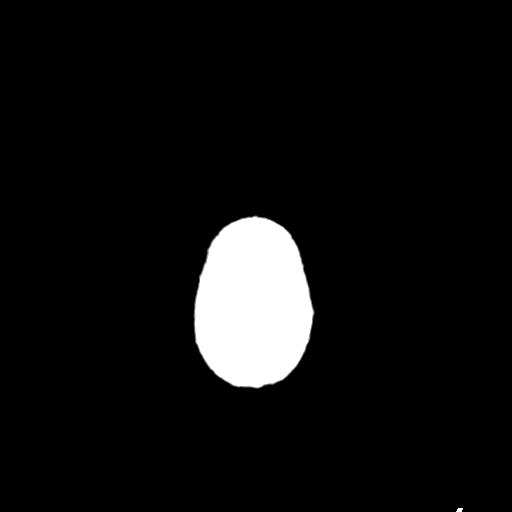

[Series 5: coronal soft tissue · coronal · 0.33mm/px · 3 of 72 slices shown]
[im 24/72  brain]
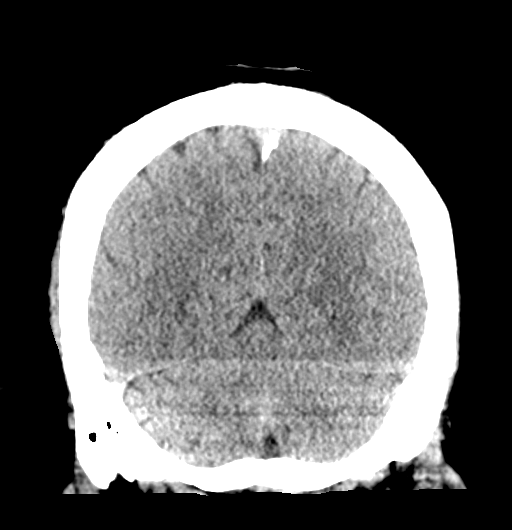
[im 32/72  brain]
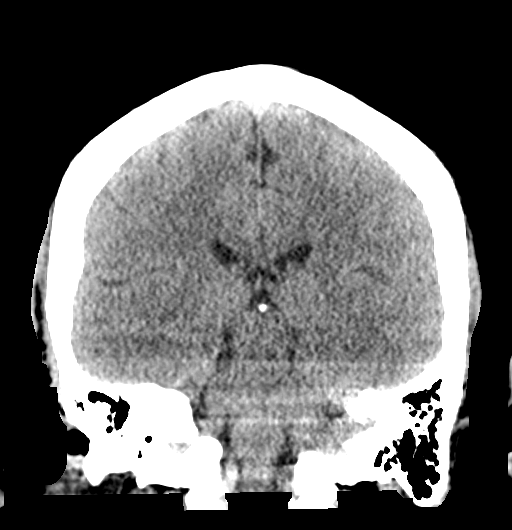
[im 40/72  brain]
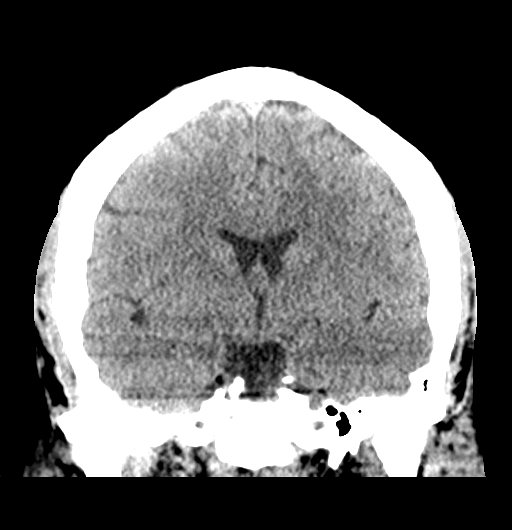

[Series 6: sagittal soft tissue · sagittal · 0.34mm/px · 3 of 57 slices shown]
[im 19/57  brain]
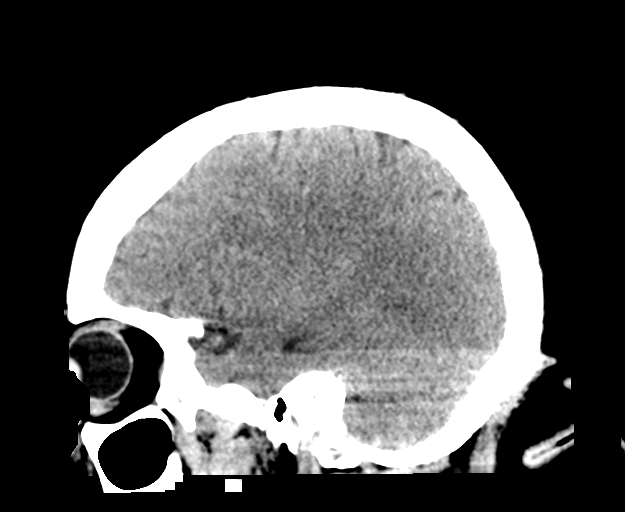
[im 29/57  brain]
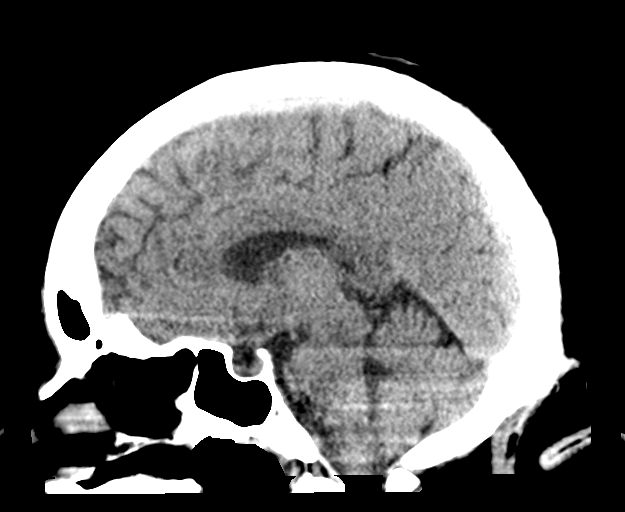
[im 38/57  brain]
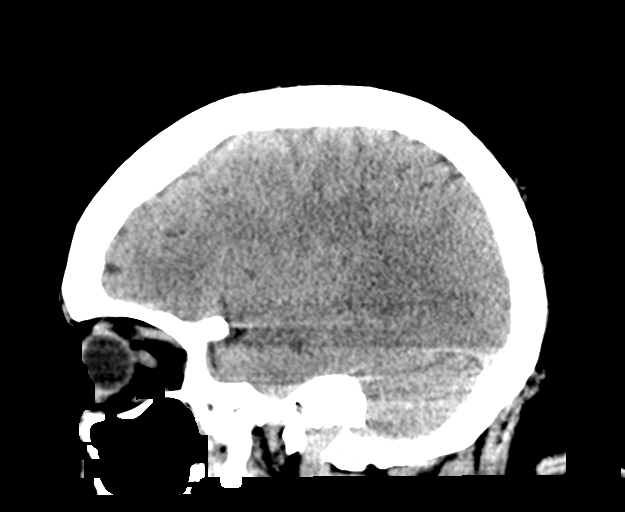

[16 of 47 positions shown; findings below may reference images not displayed]

FINDINGS: Brain: Normal anatomic configuration. No abnormal intra or
extra-axial mass lesion or fluid collection. No abnormal mass effect
or midline shift. No evidence of acute intracranial hemorrhage or
infarct. Ventricular size is normal. Cerebellum unremarkable.

Vascular: Unremarkable

Skull: Intact

Sinuses/Orbits: Paranasal sinuses are clear. Orbits are
unremarkable.

Other: Mastoid air cells and middle ear cavities are clear.
IMPRESSION: No acute intracranial abnormality.  Normal examination.

## 2022-08-01 ENCOUNTER — Encounter (INDEPENDENT_AMBULATORY_CARE_PROVIDER_SITE_OTHER): Payer: Medicaid Other | Admitting: Ophthalmology

## 2022-08-01 DIAGNOSIS — H33301 Unspecified retinal break, right eye: Secondary | ICD-10-CM

## 2022-08-01 DIAGNOSIS — H3581 Retinal edema: Secondary | ICD-10-CM

## 2022-08-01 DIAGNOSIS — H35033 Hypertensive retinopathy, bilateral: Secondary | ICD-10-CM

## 2022-08-01 DIAGNOSIS — B20 Human immunodeficiency virus [HIV] disease: Secondary | ICD-10-CM

## 2022-08-01 DIAGNOSIS — E119 Type 2 diabetes mellitus without complications: Secondary | ICD-10-CM

## 2022-08-01 DIAGNOSIS — I10 Essential (primary) hypertension: Secondary | ICD-10-CM

## 2022-08-01 DIAGNOSIS — H25813 Combined forms of age-related cataract, bilateral: Secondary | ICD-10-CM

## 2022-08-01 DIAGNOSIS — H40053 Ocular hypertension, bilateral: Secondary | ICD-10-CM

## 2022-08-15 ENCOUNTER — Encounter (INDEPENDENT_AMBULATORY_CARE_PROVIDER_SITE_OTHER): Payer: Medicaid Other | Admitting: Ophthalmology

## 2022-08-15 DIAGNOSIS — B20 Human immunodeficiency virus [HIV] disease: Secondary | ICD-10-CM

## 2022-08-15 DIAGNOSIS — H33301 Unspecified retinal break, right eye: Secondary | ICD-10-CM

## 2022-08-15 DIAGNOSIS — H25813 Combined forms of age-related cataract, bilateral: Secondary | ICD-10-CM

## 2022-08-15 DIAGNOSIS — I10 Essential (primary) hypertension: Secondary | ICD-10-CM

## 2022-08-15 DIAGNOSIS — E119 Type 2 diabetes mellitus without complications: Secondary | ICD-10-CM

## 2022-08-15 DIAGNOSIS — H35033 Hypertensive retinopathy, bilateral: Secondary | ICD-10-CM

## 2022-08-15 DIAGNOSIS — H40053 Ocular hypertension, bilateral: Secondary | ICD-10-CM

## 2023-01-09 ENCOUNTER — Emergency Department (HOSPITAL_COMMUNITY): Payer: Medicaid Other

## 2023-01-09 ENCOUNTER — Other Ambulatory Visit: Payer: Self-pay

## 2023-01-09 ENCOUNTER — Emergency Department (HOSPITAL_COMMUNITY)
Admission: EM | Admit: 2023-01-09 | Discharge: 2023-01-09 | Disposition: A | Discharge status: Home / Self Care | Payer: Medicaid Other | Attending: Emergency Medicine | Admitting: Emergency Medicine

## 2023-01-09 ENCOUNTER — Encounter (HOSPITAL_COMMUNITY): Payer: Self-pay

## 2023-01-09 DIAGNOSIS — Z7984 Long term (current) use of oral hypoglycemic drugs: Secondary | ICD-10-CM | POA: Diagnosis not present

## 2023-01-09 DIAGNOSIS — Z950 Presence of cardiac pacemaker: Secondary | ICD-10-CM | POA: Insufficient documentation

## 2023-01-09 DIAGNOSIS — Z7901 Long term (current) use of anticoagulants: Secondary | ICD-10-CM | POA: Insufficient documentation

## 2023-01-09 DIAGNOSIS — R079 Chest pain, unspecified: Secondary | ICD-10-CM | POA: Insufficient documentation

## 2023-01-09 DIAGNOSIS — E119 Type 2 diabetes mellitus without complications: Secondary | ICD-10-CM | POA: Diagnosis not present

## 2023-01-09 DIAGNOSIS — J45909 Unspecified asthma, uncomplicated: Secondary | ICD-10-CM | POA: Diagnosis not present

## 2023-01-09 DIAGNOSIS — R5383 Other fatigue: Secondary | ICD-10-CM | POA: Insufficient documentation

## 2023-01-09 DIAGNOSIS — B20 Human immunodeficiency virus [HIV] disease: Secondary | ICD-10-CM | POA: Diagnosis not present

## 2023-01-09 DIAGNOSIS — R0602 Shortness of breath: Secondary | ICD-10-CM | POA: Diagnosis present

## 2023-01-09 HISTORY — DX: Herpesviral infection, unspecified: B00.9

## 2023-01-09 HISTORY — DX: Other pulmonary embolism without acute cor pulmonale: I26.99

## 2023-01-09 HISTORY — DX: Type 2 diabetes mellitus without complications: E11.9

## 2023-01-09 LAB — BASIC METABOLIC PANEL
Anion gap: 8 (ref 5–15)
BUN: 13 mg/dL (ref 6–20)
CO2: 26 mmol/L (ref 22–32)
Calcium: 8.9 mg/dL (ref 8.9–10.3)
Chloride: 103 mmol/L (ref 98–111)
Creatinine, Ser: 0.8 mg/dL (ref 0.44–1.00)
GFR, Estimated: >60 mL/min (ref >60)
Glucose, Bld: 215 mg/dL — ABNORMAL HIGH (ref 70–99)
Potassium: 4 mmol/L (ref 3.5–5.1)
Sodium: 137 mmol/L (ref 135–145)

## 2023-01-09 LAB — CBC
HCT: 43.7 % (ref 36.0–46.0)
Hemoglobin: 14 g/dL (ref 12.0–15.0)
MCH: 27.6 pg (ref 26.0–34.0)
MCHC: 32 g/dL (ref 30.0–36.0)
MCV: 86 fL (ref 80.0–100.0)
Platelets: 271 10*3/uL (ref 150–400)
RBC: 5.08 MIL/uL (ref 3.87–5.11)
RDW: 12.4 % (ref 11.5–15.5)
WBC: 6.6 10*3/uL (ref 4.0–10.5)
nRBC: 0 % (ref 0.0–0.2)

## 2023-01-09 LAB — TSH: TSH: 0.378 u[IU]/mL (ref 0.350–4.500)

## 2023-01-09 LAB — MAGNESIUM: Magnesium: 2 mg/dL (ref 1.7–2.4)

## 2023-01-09 LAB — TROPONIN I (HIGH SENSITIVITY)
Troponin I (High Sensitivity): 140 ng/L (ref <18)
Troponin I (High Sensitivity): 157 ng/L (ref <18)

## 2023-01-09 LAB — BRAIN NATRIURETIC PEPTIDE: B Natriuretic Peptide: 65.5 pg/mL (ref 0.0–100.0)

## 2023-01-09 MED ORDER — IOHEXOL 350 MG/ML SOLN
75.0000 mL | Freq: Once | INTRAVENOUS | Status: AC | PRN
  Administered 2023-01-09: 75 mL via INTRAVENOUS

## 2023-01-09 NOTE — ED Triage Notes (Signed)
Pt arrives with c/o CP/SHOB than started around midnight- states it is worse when lying or sitting down.

## 2023-01-09 NOTE — ED Provider Notes (Signed)
[place] EMERGENCY DEPARTMENT AT [facility] Provider Note   CSN: [id number] Arrival date & time: 01/09/23  1653     History  Chief Complaint  Patient presents with   Chest Pain   Shortness of Breath    Lynn Bates is a 52 y.o. female.   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  Patient reports she could not sleep because of shortness of breath, says she did have 1 episode of mild chest pain but has not had any chest pain ever since.Patient said she uses her inhaler about 1-2 times every other month Denies any cough,recent sick contacts or travel. Patient denies any fevers chills at this time.  Denies any nausea or vomiting.     Home Medications Prior to Admission medications   Medication Sig Start Date End Date Taking? Authorizing Provider  acetaminophen (TYLENOL) 500 MG tablet Take 1,000 mg by mouth every 6 (six) hours as needed for mild pain.    [provider]  apixaban (ELIQUIS) 5 MG TABS tablet Take 1 tablet (5 mg total) by mouth in the morning and at bedtime. 11/11/21   Almon Hercules, MD  atorvastatin (LIPITOR) 20 MG tablet Take 1 tablet (20 mg total) by mouth daily. 11/11/21 11/11/22  Almon Hercules, MD  DESCOVY 200-25 MG tablet Take 1 tablet by mouth daily. 06/24/21   [provider]  fluticasone (FLONASE) 50 MCG/ACT nasal spray two sprays by Both Nostrils route daily. 09/23/20   [provider]  gabapentin (NEURONTIN) 300 MG capsule Take 1 capsule (300 mg total) by mouth at bedtime.  11/11/21 01/10/22  Almon Hercules, MD  latanoprost (XALATAN) 0.005 % ophthalmic solution Place 1 drop into both eyes at bedtime. Patient not taking: Reported on 11/09/2021 02/02/17   [provider]  linaclotide Karlene Einstein) 145 MCG CAPS capsule Take 145 mcg by mouth daily. Patient not taking: Reported on 11/09/2021 06/05/17   [provider]  losartan-hydrochlorothiazide (HYZAAR) 50-12.5 MG tablet Take 1 tablet by mouth daily. 11/11/21 01/10/22  Almon Hercules, MD  meclizine (ANTIVERT) 25 MG tablet Take 1 tablet (25 mg total) by mouth 3 (three) times daily as needed for dizziness. 05/15/21   Couture, Cortni S, PA-C  metFORMIN (GLUCOPHAGE) 1000 MG tablet Take 1 tablet (1,000 mg total) by mouth 2 (two) times daily with a meal. 11/11/21   Almon Hercules, MD  metoprolol tartrate (LOPRESSOR) 50 MG tablet Take 50 mg by mouth in the morning and at bedtime. 10/07/15   [provider]  montelukast (SINGULAIR) 10 MG tablet Take by mouth. 03/17/20   [provider]  pantoprazole (PROTONIX) 40 MG tablet Take 40 mg by mouth daily. 04/25/15   [provider]  raltegravir (ISENTRESS) 400 MG tablet Take 400 mg by mouth in the morning and at bedtime. 08/03/09   [provider]  risperiDONE (RISPERDAL) 2 MG tablet Take 1 tablet by mouth daily.    [provider]  Semaglutide, 1 MG/DOSE, 4 MG/3ML SOPN Inject 1 mg into the skin once a week. 11/11/21  Almon Hercules, MD  Tiotropium Bromide Monohydrate 2.5 MCG/ACT AERS Inhale 2 puffs into the lungs daily. Patient not taking: Reported on 11/09/2021    [provider]  triamcinolone cream (KENALOG) 0.1 % Apply 1 application topically in the morning and at bedtime. Patient not taking: Reported on 11/09/2021 06/11/19   [provider]  valACYclovir (VALTREX) 500 MG tablet Take 1 tablet (500 mg total) by mouth daily. Take 1 tablet by mouth. Can increase to twice a day for 5 days in event of reoccurence 12/26/21    Constant, Gigi Gin, MD      Allergies    Patient has no known allergies.    Review of Systems   Review of Systems  Constitutional:  Positive for fatigue. Negative for chills, fever and unexpected weight change.  Respiratory:  Positive for shortness of breath.   Cardiovascular:  Positive for chest pain.    Physical Exam Updated Vital Signs BP 111/70   Pulse 82   Temp 98.1 F (36.7 C) (Oral)   Resp 12   SpO2 94%  Physical Exam Constitutional:      General: She is not in acute distress.    Appearance: She is obese. She is not ill-appearing, toxic-appearing or diaphoretic.  Cardiovascular:     Heart sounds: Heart sounds not distant. No murmur heard.    No systolic murmur is present.  Pulmonary:     Effort: Pulmonary effort is normal. No tachypnea, accessory muscle usage or respiratory distress.     Breath sounds: No stridor.  Chest:     Chest wall: No tenderness or crepitus.  Neurological:     General: No focal deficit present.     Mental Status: She is alert and oriented to person, place, and time.     ED Results / Procedures / Treatments   Labs (all labs ordered are listed, but only abnormal results are displayed) Labs Reviewed  BASIC METABOLIC PANEL - Abnormal; Notable for the following components:      Result Value   Glucose, Bld 215 (*)    All other components within normal limits  BRAIN NATRIURETIC PEPTIDE  MAGNESIUM  TSH  CBC  TROPONIN I (HIGH SENSITIVITY)    EKG EKG Interpretation Date/Time:  Tuesday January 09 2023 17:25:13 EDT Ventricular Rate:  84 PR Interval:  189 QRS Duration:  124 QT Interval:  440 QTC Calculation: 521 R Axis:   74  Text Interpretation: Sinus rhythm Probable left atrial enlargement Nonspecific intraventricular conduction delay when compared to prior, change in morphology of QRS complex. No STEMI Confirmed by Theda Belfast ([id number]) on 01/09/2023 6:13:04 PM  Radiology CT Angio Chest PE W and/or Wo Contrast  Result Date:  01/09/2023 CLINICAL DATA:  Chest pain and shortness of breath. EXAM: CT ANGIOGRAPHY CHEST WITH CONTRAST TECHNIQUE: Multidetector CT imaging of the chest was performed using the standard protocol during bolus administration of intravenous contrast. Multiplanar CT image reconstructions and MIPs were obtained to evaluate the vascular anatomy. RADIATION DOSE REDUCTION: This exam was performed according to the departmental dose-optimization program which includes automated exposure control, adjustment of the mA and/or kV according to patient size and/or use of iterative reconstruction technique. CONTRAST:  75mL OMNIPAQUE IOHEXOL 350 MG/ML SOLN COMPARISON:  November 08, 2021 FINDINGS: Cardiovascular: A dual lead AICD is in place. There is mild calcification of the aortic arch, without evidence of aortic aneurysm. Satisfactory opacification of the pulmonary arteries to the segmental level. No evidence of pulmonary embolism. There is mild cardiomegaly. No pericardial  effusion. Mediastinum/Nodes: No enlarged mediastinal, hilar, or axillary lymph nodes. Thyroid gland, trachea, and esophagus demonstrate no significant findings. Lungs/Pleura: Mild lingular and mild left basilar atelectasis is seen. There is no evidence of a pleural effusion or pneumothorax. Upper Abdomen: No acute abnormality. Musculoskeletal: No chest wall abnormality. No acute or significant osseous findings. Review of the MIP images confirms the above findings. IMPRESSION: 1. No evidence of pulmonary embolism. 2. Mild lingular and mild left basilar atelectasis. 3. Mild cardiomegaly. 4. Aortic atherosclerosis. Aortic Atherosclerosis (ICD10-I70.0). Electronically Signed   By: Aram Candela M.D.   On: 01/09/2023 20:08   DG Chest 2 View  Result Date: 01/09/2023 CLINICAL DATA:  Chest pain and shortness of breath EXAM: CHEST - 2 VIEW COMPARISON:  11/08/2021 FINDINGS: Stable cardiomegaly without acute CHF, edema, pneumonia, significant collapse or  consolidation. No large effusion or pneumothorax. Trachea midline. Left subclavian 2 lead pacer again noted. No acute osseous finding. IMPRESSION: Cardiomegaly without acute process.  Stable exam. Electronically Signed   By: Judie Petit.  Shick M.D.   On: 01/09/2023 18:21    Procedures Procedures    Medications Ordered in ED Medications  iohexol (OMNIPAQUE) 350 MG/ML injection 75 mL (75 mLs Intravenous Contrast Given 01/09/23 1950)    ED Course/ Medical Decision Making/ A&P Clinical Course as of 01/09/23 2325  Tue Jan 09, 2023  2101 B Natriuretic Peptide: 65.5 [PA]  2115 Troponin I (High Sensitivity) [PA]  2128 Troponin I (High Sensitivity)(!!) [PA]  2305 Troponin I (High Sensitivity) [PA]  2311 Troponin I (High Sensitivity) [PA]  2313 Troponin I (High Sensitivity) [PA]  2323 Troponin I (High Sensitivity) [PA]  2323 Troponin I (High Sensitivity)(!!) [PA]  2324 Troponin I (High Sensitivity) [PA]    Clinical Course User Index [PA] Kathleen Lime, MD                                 Medical Decision Making Amount and/or Complexity of Data Reviewed External Data Reviewed: notes.    Details: Pt's cardiologist notes adequately reviewed  Labs: ordered. Decision-making details documented in ED Course.    Details: TSH , BNP, BMP Magnesium Radiology: ordered and independent interpretation performed. Decision-making details documented in ED Course.    Details: CT angio didn't show any signs of pulmonary embolism ECG/medicine tests: ordered and independent interpretation performed. Decision-making details documented in ED Course.    Details: EKG  Discussion of management or test interpretation with external provider(s): Presentation discussed with cardiology to recommend getting a single Trop and if it comes back normal ,have patient follow-up with her cardiologist.  Risk Prescription drug management.    #Shortness of breath  Patient has a history of HOCM with pacemaker in place presenting to  the ED due to concerns for shortness of breath for the past couple days.  I wonder if this is acute myocardial ischemia, heart failure, cardiac tamponade, pulmonary embolism, pneumothorax or pneumonia.  Patient denies any recent hospitalization or recent travels.   On exam, there is no evidence of calf swelling or tenderness, denies any chest pain,less suspicion of PE at this time but can't completely  be rule out. CT angio however didn't show any evidence of pulmonary embolism at this time. BNP is 65.5 less concerning for any acute heart failure considering her body habitus. TSH came back normal at 0.378. CBC and BMP unremarkable except glucose of 215 which is consistent with her diabetes. Due to patient's cardiac history and  this new onset of shortness of breath that she did not have before, I wondered if patient would benefit from ambulatory pulse oximetry and cardiology consult.Consulted Cardiology, they recommended getting a single trop and if it comes back normal, have pt follow up with her cardiologist outpatient and ambulatory pulse oximetry is unremarkable. Troponin came back high of 140 which is concerning. Called Cards again and they recommend getting a 2nd trops which came back 157.Cards still recommended discharge home with cardiology follow up  - Patient is stable for discharge. - Follow up on outpatient cardiology           Final Clinical Impression(s) / ED Diagnoses Final diagnoses:  None    Rx / DC Orders ED Discharge Orders     None         Kathleen Lime, MD 01/09/23 2331    Tegeler, Canary Brim, MD 01/10/23 1049

## 2023-01-09 NOTE — ED Notes (Signed)
While ambulating in department PT 02 stat 92-94% on room air

## 2023-08-15 ENCOUNTER — Encounter (INDEPENDENT_AMBULATORY_CARE_PROVIDER_SITE_OTHER): Admitting: Ophthalmology

## 2023-08-29 NOTE — Progress Notes (Signed)
 [facility] - Clinic Note  08/31/2023     CHIEF COMPLAINT Patient presents for Retina Follow Up   HISTORY OF PRESENT ILLNESS: Lynn Bates is a 53 y.o. female who presents to the clinic today for:   HPI     Retina Follow Up   Patient presents with  Other.  In both eyes.  This started 1 year ago.  I, the attending physician,  performed the HPI with the patient and updated documentation appropriately.        Comments   Patient here for 1 year retina follow up for DM exam. Patient states vision doing alright. Not the best but alright. Sometimes has eye pain. Using drops.      Last edited by Taray Normoyle, MD on 09/03/2023  9:17 PM.    Pt is delayed to follow up from 1 year to 4 years, she states her vision is doing pretty good, her last A1c was 6.8 in January, she is on Ozempic , pt saw Dr. Candi Chafe 2 weeks ago, she did not get a new Rx, but they told her to keep taking her latanoprost, her IOP was high in there office, but they did not add any drops  Referring physician: Franky Ivanoff, PA-C [address] [place],  [place] [address]  HISTORICAL INFORMATION:   Selected notes from the MEDICAL RECORD NUMBER Referred by Franky Ivanoff, PA-C for concern of pigmented holes OD LEE:08.05.20 (A. Lundquist) [BCVA: 20/20 OU]  Ocular Hx-glaucoma suspect, DES, NS PMH-DM, HTN, blood clots   CURRENT MEDICATIONS: Current Outpatient Medications (Ophthalmic Drugs)  Medication Sig   dorzolamide -timolol  (COSOPT ) 2-0.5 % ophthalmic solution Place 1 drop into both eyes 2 (two) times daily.   latanoprost (XALATAN) 0.005 % ophthalmic solution Place 1 drop into both eyes at bedtime. (Patient not taking: Reported on 11/09/2021)   No current facility-administered medications for this visit. (Ophthalmic Drugs)   Current Outpatient Medications (Other)  Medication Sig   acetaminophen  (TYLENOL ) 500 MG tablet Take 1,000 mg by mouth every 6 (six) hours as needed for mild pain.    apixaban  (ELIQUIS ) 5 MG TABS tablet Take 1 tablet (5 mg total) by mouth in the morning and at bedtime.   DESCOVY  200-25 MG tablet Take 1 tablet by mouth daily.   fluticasone (FLONASE) 50 MCG/ACT nasal spray two sprays by Both Nostrils route daily.   meclizine  (ANTIVERT ) 25 MG tablet Take 1 tablet (25 mg total) by mouth 3 (three) times daily as needed for dizziness.   metFORMIN  (GLUCOPHAGE ) 1000 MG tablet Take 1 tablet (1,000 mg total) by mouth 2 (two) times daily with a meal.   metoprolol  tartrate (LOPRESSOR ) 50 MG tablet Take 50 mg by mouth in the morning and at bedtime.   montelukast  (SINGULAIR ) 10 MG tablet Take by mouth.   pantoprazole  (PROTONIX ) 40 MG tablet Take 40 mg by mouth daily.   raltegravir  (ISENTRESS ) 400 MG tablet Take 400 mg by mouth in the morning and at bedtime.   risperiDONE  (RISPERDAL ) 2 MG tablet Take 1 tablet by mouth daily.   Semaglutide , 1 MG/DOSE, 4 MG/3ML SOPN Inject 1 mg into the skin once a week.   valACYclovir  (VALTREX ) 500 MG tablet Take 1 tablet (500 mg total) by mouth daily. Take 1 tablet by mouth. Can increase to twice a day for 5 days in event of reoccurence   atorvastatin  (LIPITOR) 20 MG tablet Take 1 tablet (20 mg total) by mouth daily.   gabapentin  (NEURONTIN ) 300 MG capsule  Take 1 capsule (300 mg total) by mouth at bedtime.   linaclotide (LINZESS) 145 MCG CAPS capsule Take 145 mcg by mouth daily. (Patient not taking: Reported on 11/09/2021)   losartan -hydrochlorothiazide  (HYZAAR ) 50-12.5 MG tablet Take 1 tablet by mouth daily.   Tiotropium Bromide  Monohydrate 2.5 MCG/ACT AERS Inhale 2 puffs into the lungs daily. (Patient not taking: Reported on 11/09/2021)   triamcinolone cream (KENALOG) 0.1 % Apply 1 application topically in the morning and at bedtime. (Patient not taking: Reported on 11/09/2021)   No current facility-administered medications for this visit. (Other)   REVIEW OF SYSTEMS: ROS   Positive for: Eyes Negative for: Constitutional,  Gastrointestinal, Neurological, Skin, Genitourinary, Musculoskeletal, HENT, Endocrine, Cardiovascular, Respiratory, Psychiatric, Allergic/Imm, Heme/Lymph Last edited by Sylvan Evener, COA on 08/31/2023  8:24 AM.     ALLERGIES No Known Allergies  PAST MEDICAL HISTORY Past Medical History:  Diagnosis Date   HSV-1 (herpes simplex virus 1) infection    Hypertension    Pulmonary embolism (HCC)    Type 2 diabetes mellitus (HCC)    Past Surgical History:  Procedure Laterality Date   CESAREAN SECTION     PACEMAKER IMPLANT     2009   FAMILY HISTORY Family History  Problem Relation Age of Onset   Glaucoma Father    Glaucoma Sister    Diabetes Other    Diabetes Maternal Aunt    SOCIAL HISTORY Social History   Tobacco Use   Smoking status: Every Day   Smokeless tobacco: Current  Vaping Use   Vaping status: Never Used  Substance Use Topics   Alcohol use: Not Currently   Drug use: Never       OPHTHALMIC EXAM:   Base Eye Exam     Visual Acuity (Snellen - Linear)       Right Left   Dist cc 20/20 20/20         Tonometry (Tonopen, 8:20 AM)       Right Left   Pressure 32 28  Brimonidine and cosopt  given @ 8:20 am.        Pupils       Dark Light Shape React APD   Right 4 3 Round Brisk None   Left 4 3 Round Brisk None         Visual Fields (Counting fingers)       Left Right    Full Full         Extraocular Movement       Right Left    Full, Ortho Full, Ortho         Neuro/Psych     Oriented x3: Yes   Mood/Affect: Normal         Dilation     Both eyes: 1.0% Mydriacyl, 2.5% Phenylephrine @ 8:12 AM           Slit Lamp and Fundus Exam     Slit Lamp Exam       Right Left   Lids/Lashes Dermatochalasis - upper lid Normal   Conjunctiva/Sclera mild Melanosis, nasal Pinguecula, Trace Injection Melanosis, nasal and temporal Pinguecula, Trace Injection   Cornea Trace Punctate epithelial erosions, Arcus 1+ Punctate epithelial  erosions, Arcus   Anterior Chamber deep and clear deep and clear   Iris Round and dilated, No NVI Round and dilated, No NVI   Lens 1-2+ Nuclear sclerosis, 1-2+ Cortical cataract 1-2+ Nuclear sclerosis, 2+ Cortical cataract   Anterior Vitreous Vitreous syneresis Vitreous syneresis  Fundus Exam       Right Left   Disc Pink and Sharp Pink and Sharp   C/D Ratio 0.6 0.5   Macula Flat, Good foveal reflex, No heme or edema Flat, Good foveal reflex, RPE mottling, No heme or edema   Vessels attenuated, mild tortuosity mild attenuation, mild AV crossing changes   Periphery Attached, mild WWoP temporally, pigmented retinal defects/CR scar at 0600 equator -- good laser changes surrounding, No heme, no new RT/RD Attached, pigment changes inferiorly           IMAGING AND PROCEDURES  Imaging and Procedures for @TODAY @            ASSESSMENT/PLAN:    ICD-10-CM   1. Right retinal defect  H33.301     2. Diabetes mellitus type 2 without retinopathy (HCC)  E11.9     3. Current use of insulin  (HCC)  Z79.4     4. Long term (current) use of oral hypoglycemic drugs  Z79.84     5. Essential hypertension  I10     6. Hypertensive retinopathy of both eyes  H35.033     7. Combined forms of age-related cataract of both eyes  H25.813     8. Human immunodeficiency virus (HIV) disease (HCC)  B20     9. 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 Retinal defect OD  - focal pigmented retinal defects/CR scars located at 0600, equator  - asymptomatic  - no surrounding SRF   - s/p laser retinopexy OD (09.18.20) -- good laser changes surrounding lesions  - no new RT/RD, lattice  - f/u 1 year, sooner prn  2-4. Diabetes mellitus, type 2 without retinopathy  - A1c: 6.8 on 01.13.25  - The incidence, risk factors for progression, natural history and treatment options for diabetic retinopathy  were discussed with patient.    - The need for close monitoring of blood glucose, blood  pressure, and serum lipids, avoiding cigarette or any type of tobacco, and the need for long term follow up was also discussed with patient.  - f/u in 1 year, sooner prn   5,6. Hypertensive retinopathy OU  - discussed importance of tight BP control  - monitor  7. Mixed form age related cataracts OU  - mild  - The symptoms of cataract, surgical options, and treatments and risks were discussed with patient.  - discussed diagnosis and progression  - monitor  8. HIV+ without retinopathy  - takes Truvada and Isentress   9. Ocular hypertension OU  - IOP 32,28  - pt reports pressure was high at Dr. Marvin Slot office as well  - discussed importance of compliance with drops for vision and ocular health  - continue latanaprost qhs OU  - will add Cosopt  BID OU   Ophthalmic Meds Ordered this visit:  Meds ordered this encounter  Medications   dorzolamide -timolol  (COSOPT ) 2-0.5 % ophthalmic solution    Sig: Place 1 drop into both eyes 2 (two) times daily.    Dispense:  10 mL    Refill:  10     Return in about 1 year (around 08/30/2024) for DM exam, DFE, OCT.  There are no Patient Instructions on file for this visit.  This document serves as a record of services personally performed by Jeanice Millard, MD, PhD. It was created on their behalf by Eller Gut COT, an ophthalmic technician. The creation of this record is the provider's dictation and/or activities during the visit.  Electronically signed by: Eller Gut COT 04.16.2025 9:21 PM  This document serves as a record of services personally performed by Jeanice Millard, MD, PhD. It was created on their behalf by Morley Arabia. Bevin Bucks, OA an ophthalmic technician. The creation of this record is the provider's dictation and/or activities during the visit.    Electronically signed by: Morley Arabia. Bevin Bucks, OA 09/03/23 9:21 PM  Jeanice Millard, M.D., Ph.D. Diseases & Surgery of the Retina and Vitreous [facility] 08/31/2023   I have reviewed the above documentation for accuracy and completeness, and I agree with the above. Jeanice Millard, M.D., Ph.D. 09/03/23 9:23 PM    Abbreviations: M myopia (nearsighted); A astigmatism; H hyperopia (farsighted); P presbyopia; Mrx spectacle prescription;  CTL contact lenses; OD right eye; OS left eye; OU both eyes  XT exotropia; ET esotropia; PEK punctate epithelial keratitis; PEE punctate epithelial erosions; DES dry eye syndrome; MGD meibomian gland dysfunction; ATs artificial tears; PFAT's preservative free artificial tears; NSC nuclear sclerotic cataract; PSC posterior subcapsular cataract; ERM epi-retinal membrane; PVD posterior vitreous detachment; RD retinal detachment; DM diabetes mellitus; DR diabetic retinopathy; NPDR non-proliferative diabetic retinopathy; PDR proliferative diabetic retinopathy; CSME clinically significant macular edema; DME diabetic macular edema; dbh dot blot hemorrhages; CWS cotton wool spot; POAG primary open angle glaucoma; C/D cup-to-disc ratio; HVF humphrey visual field; GVF goldmann visual field; OCT optical coherence tomography; IOP intraocular pressure; BRVO Branch retinal vein occlusion; CRVO central retinal vein occlusion; CRAO central retinal artery occlusion; BRAO branch retinal artery occlusion; RT retinal tear; SB scleral buckle; PPV pars plana vitrectomy; VH Vitreous hemorrhage; PRP panretinal laser photocoagulation; IVK intravitreal kenalog; VMT vitreomacular traction; MH Macular hole;  NVD neovascularization of the disc; NVE neovascularization elsewhere; AREDS age related eye disease study; ARMD age related macular degeneration; POAG primary open angle glaucoma; EBMD epithelial/anterior basement membrane dystrophy; ACIOL anterior chamber intraocular lens; IOL intraocular lens; PCIOL posterior chamber intraocular lens; Phaco/IOL phacoemulsification with intraocular lens placement; PRK photorefractive keratectomy; LASIK laser  assisted in situ keratomileusis; HTN hypertension; DM diabetes mellitus; COPD chronic obstructive pulmonary disease

## 2023-08-31 ENCOUNTER — Ambulatory Visit (INDEPENDENT_AMBULATORY_CARE_PROVIDER_SITE_OTHER): Admitting: Ophthalmology

## 2023-08-31 ENCOUNTER — Encounter (INDEPENDENT_AMBULATORY_CARE_PROVIDER_SITE_OTHER): Payer: Self-pay | Admitting: Ophthalmology

## 2023-08-31 DIAGNOSIS — H35033 Hypertensive retinopathy, bilateral: Secondary | ICD-10-CM

## 2023-08-31 DIAGNOSIS — B20 Human immunodeficiency virus [HIV] disease: Secondary | ICD-10-CM

## 2023-08-31 DIAGNOSIS — E119 Type 2 diabetes mellitus without complications: Secondary | ICD-10-CM | POA: Diagnosis not present

## 2023-08-31 DIAGNOSIS — H33301 Unspecified retinal break, right eye: Secondary | ICD-10-CM

## 2023-08-31 DIAGNOSIS — I10 Essential (primary) hypertension: Secondary | ICD-10-CM

## 2023-08-31 DIAGNOSIS — H40053 Ocular hypertension, bilateral: Secondary | ICD-10-CM

## 2023-08-31 DIAGNOSIS — Z794 Long term (current) use of insulin: Secondary | ICD-10-CM

## 2023-08-31 DIAGNOSIS — H25813 Combined forms of age-related cataract, bilateral: Secondary | ICD-10-CM

## 2023-08-31 DIAGNOSIS — Z7984 Long term (current) use of oral hypoglycemic drugs: Secondary | ICD-10-CM

## 2023-08-31 MED ORDER — DORZOLAMIDE HCL-TIMOLOL MAL 2-0.5 % OP SOLN: 1.0000 [drp] | Freq: Two times a day (BID) | OPHTHALMIC | 10 refills | Status: AC

## 2023-09-03 ENCOUNTER — Encounter (INDEPENDENT_AMBULATORY_CARE_PROVIDER_SITE_OTHER): Payer: Self-pay | Admitting: Ophthalmology

## 2024-01-18 NOTE — Progress Notes (Signed)
 [facility] cardiology EP office Visit  Note  PCP: Norman Joesph Dull, PA-C   Portion of this note were dictated using DRAGON voices recognition software. Please disregard any errors in transcription .    Reason for Visit:       She is here for follow-up management of her pacemaker, history of hypertrophic cardiomyopathy.        Assessment & Plan   1.  History of hypertrophic cardiomyopathy with severe obstruction.  And she is doing for well deny any shortness of breath.  She has not had an MRI in past.  However we need to repeat her 2D echocardiogram next visit.   2.  Essential  Hypertension: - Blood pressure is well-managed by her primary care physician. - Currently taking metoprolol . - No changes to her medication regimen are necessary at this time.  3.  History of tuberculosis: Was treated. - Completed tuberculosis treatment regimen 4 months ago and reports being cleared by the health department. - Advised to avoid close contact with individuals who have active tuberculosis. - Instructed to wear a mask when necessary to prevent reinfection. - Discussed that despite completing treatment, reinfection is possible if exposed again.  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  Pacemaker management: Stable by interrogation. - Pacemaker, implanted in 2014, is functioning well. - No issues were reported.  5.  History of pulmonary embolism in past that was treated by her primary physician.   6.  Morbid obesity she needs to lose weight by decrease calorie and exercise.   Follow-up: The patient will follow up in 1 year.  Also advised follow-up with her primary physician is required in the MRI 2D echocardiogram next visit.    Orders   Orders Placed This Encounter  Procedures  . ECG 12 lead  . No follow-ups on file.  Portions of this note may have been created utilizing [employer] dictation software and may  contain transcription errors that were missed during proofreading.   History of Present Illness  Lynn Bates 53 year old female with past medical history of dual-chamber Medtronic pacemaker implant 08/21/2012, essential hypertension, history of tuberculosis that has been treated, history of pulmonary embolism, history of myocardial perfusion study negative for ischemia LVEF of 52%.  Also in past has had 2D echocardiogram severe obstructive cardiomyopathy..  She reports that her pacemaker, implanted in 2014, is functioning well. Her primary care physician, Dr. Waddell, manages her high blood pressure, which is currently under control with metoprolol .  She has a history of tuberculosis, which was diagnosed through testing by her HIV doctor. She completed her treatment regimen 4 months ago and believes the disease is now resolved. She was informed that she can not contract tuberculosis again as she has completed her medication course.    Physical Examination:  VITAL SIGNS:  Vitals:   01/18/24 1058  BP: 118/81  Pulse: 94  SpO2: 96%   @WEIGHTVITALS @  General:  Resting comfortably in NAD Neuro:  Alert & oriented X 3.   Resp:  Resp even and nonlabored.  Lungs sounds clear throughout CV:  Regular rate and rhythm,no murmur , No gallops or rubs GI:  Soft, nontender, non-distended, NABS Ext:  No edema Neck:  No JVD Pulses:  2+ and symmetrical upper and lower extremities  Hildegard Rinks, MD, MD, [facility] 01/18/2024, 11:39 AM   Past Medical History,  Past Surgical History, Family History, Social History, Medications, Allergies, Social History: As reviewed in [place]  Review of Systems: All positive and pertinent negatives are noted in the HPI; otherwise all other systems are negative  Objective Data Reviewed During this Patient Encounter:   EKG: Sinus rhythm with rate 78 bpm.  Interrogation of dual-chamber pacemaker Medtronic shows all value with normal range.  Sensing threshold and impedance she  has had 3 episodes of high ventricular rate and high atrial rate but however blood pressure is low we cannot increase her medication but they are not too many.  Lab Results  Component Value Date   CHOL 238 (H) 10/23/2023   TRIG 126 10/23/2023   HDL 50 (L) 10/23/2023   Lab Results  Component Value Date   WBC 5.90 09/01/2022   HGB 14.8 09/01/2022   HCT 44.1 09/01/2022   PLT 319 09/01/2022   Lab Results  Component Value Date   NA 136 12/21/2023   K 4.6 12/21/2023   CL 98 12/21/2023   CO2 30 12/21/2023   BUN 9 12/21/2023   CREATININE 0.59 (L) 12/21/2023   AST 14 12/21/2023   ALT 16 12/21/2023     Current Medications[1]       [1]  Current Outpatient Medications:  .  albuterol HFA (Ventolin HFA) 90 mcg/actuation inhaler, INL 1 TO 2 PFS PO Q 4 TO 6 H PRN (Patient taking differently: daily as needed.), Disp: , Rfl:  .  amLODIPine (NORVASC) 10 mg tablet, Take 1 tablet (10 mg total) by mouth daily., Disp: 90 tablet, Rfl: 3 .  budesonide-formoteroL (Symbicort) 160-4.5 mcg/actuation inhaler, Inhale. (Patient taking differently: Inhale daily as needed.), Disp: , Rfl:  .  dorzolamide -timoloL  (COSOPT ) 22.3-6.8 mg/mL ophthalmic solution, Administer 1 drop into both eyes 2 (two) times a day., Disp: , Rfl:  .  dulaglutide (Trulicity) 0.75 mg/0.5 mL subcutaneous pen injector, Inject 0.5 mL (0.75 mg total) under the skin every 7 days for 28 days. Call PCP after 3rd injection., Disp: 2 mL, Rfl: 0 .  Eliquis  5 mg tab, 5 mg 2 (two) times a day., Disp: , Rfl:  .  emtricitabine -tenofovir  alafen (Descovy ) 200-25 mg tablet, Take 1 tablet by mouth daily., Disp: 30 tablet, Rfl: 5 .  furosemide (LASIX) 20 mg tablet, TAKE 1 TABLET BY MOUTH TWICE DAILY, Disp: 180 tablet, Rfl: 1 .  glipiZIDE (GLUCOTROL XL) 10 mg 24 hr tablet, Take 1 tablet (10 mg total) by mouth daily., Disp: 90 tablet, Rfl: 3 .  glucose blood (Accu-Chek Guide test strips) test strip, Check blood glucose levels at least once daily, Disp:  100 each, Rfl: 3 .  hydrocortisone 2.5 % cream, Apply to the affected areas of the face daily PRN, Disp: 28 g, Rfl: 4 .  insulin  glargine (LANTUS  SoloStar) 100 unit/mL (3 mL) pen, Inject 10 Units under the skin 2 (two) times a day., Disp: 15 mL, Rfl: 4 .  Lancets misc, Check blood glucose levels at least once daily, Disp: 100 each, Rfl: 5 .  latanoprost (XALATAN) 0.005 % ophthalmic solution, Administer 1 drop into both eyes nightly., Disp: 7.5 mL, Rfl: 3 .  losartan  (COZAAR ) 25 mg tablet, Take 1 tablet (25 mg total) by mouth daily., Disp: 90 tablet, Rfl: 3 .  metoprolol  tartrate (LOPRESSOR ) 50 mg tablet, TAKE 1 TABLET BY MOUTH TWICE DAILY, Disp: 180 tablet, Rfl: 1 .  pantoprazole  (PROTONIX ) 40 mg EC tablet, Take 1 tablet (40 mg total) by mouth every morning before breakfast., Disp: 30 tablet, Rfl: 5 .  pen needle, diabetic 32 gauge x 5/32 ndle, Use to inject medication  twice daily, Disp: 200 each, Rfl: 1 .  polyethylene glycol (Miralax) 17 gram powd powder, Take 17 g by mouth daily. Hold for diarrhea (Patient taking differently: Take 17 g by mouth daily as needed. Hold for diarrhea), Disp: 510 g, Rfl: 1 .  raltegravir  (ISENTRESS ) 400 mg tablet, Take 1 tablet (400 mg total) by mouth 2 (two) times a day., Disp: 60 tablet, Rfl: 5 .  risperiDONE  (RisperDAL ) 2 mg tab tablet, Take 2 mg by mouth Once Daily., Disp: , Rfl:  .  rosuvastatin (CRESTOR) 10 mg tablet, Take 1 tablet (10 mg total) by mouth daily., Disp: 90 tablet, Rfl: 3 .  triamcinolone acetonide (KENALOG) 0.1 % cream, Apply twice daily on the legs PRN (Patient taking differently: daily as needed. Apply twice daily on the legs PRN), Disp: 454 g, Rfl: 2 .  pyridoxine (VITAMIN B6) 25 mg tablet, Take 1 tablet (25 mg total) by mouth daily. (Patient not taking: Reported on 01/18/2024), Disp: 30 tablet, Rfl: 8 .  tiotropium bromide  (Spiriva  Respimat) 2.5 mcg/actuation mist inhaler, Inhale 2 puffs Once Daily. (Patient not taking: Reported on 01/18/2024),  Disp: , Rfl:

## 2024-02-06 NOTE — Progress Notes (Signed)
 CC: Annual Exam  Subjective:    Lynn Bates is a 52 y.o. y/o [id number] who presents today for annual exam No LMP recorded. Patient is postmenopausal.   ______________________________________________________________________  Surgical History[1]  Allergies[2]  Medical History[3]  OB History  Gravida Para Term Preterm AB Living  7 7 7  0 0 7  SAB IAB Ectopic Molar Multiple Live Births  0 0 0 0 0 7    # Outcome Date GA Lbr Len/2nd Weight Sex Type Anes PTL Lv  7 Term      CSECT   LIV  6 Term      CSECT   LIV  5 Term      CSECT   LIV  4 Term      CSECT   LIV  3 Term      CSECT   LIV  2 Term      NSVD   LIV  1 Term      NSVD   LIV     Family History[4]  Social History   Socioeconomic History  . Marital status: Single    Spouse name: Not on file  . Number of children: Not on file  . Years of education: Not on file  . Highest education level: Not on file  Occupational History  . Not on file  Tobacco Use  . Smoking status: Former    Current packs/day: 0.00    Types: Cigarettes    Quit date: 03/10/2012    Years since quitting: 11.9    Passive exposure: Never  . Smokeless tobacco: Never  Vaping Use  . Vaping status: Never Used  Substance and Sexual Activity  . Alcohol use: No  . Drug use: No  . Sexual activity: Not Currently  Other Topics Concern  . Not on file  Social History Narrative  . Not on file   Social Drivers of Health   Food Insecurity: Low Risk  (01/18/2024)   Food vital sign   . Within the past 12 months, you worried that your food would run out before you got money to buy more: Never true   . Within the past 12 months, the food you bought just didn't last and you didn't have money to get more: Never true  Transportation Needs: No Transportation Needs (10/23/2023)   Transportation   . In the past 12 months, has lack of reliable transportation kept you from medical appointments, meetings, work or from getting things needed for daily living? : No   Safety: Low Risk  (01/18/2024)   Safety   . How often does anyone, including family and friends, physically hurt you?: Never   . How often does anyone, including family and friends, insult or talk down to you?: Never   . How often does anyone, including family and friends, threaten you with harm?: Never   . How often does anyone, including family and friends, scream or curse at you?: Never  Living Situation: Low Risk  (01/18/2024)   Living Situation   . What is your living situation today?: I have a steady place to live   . Think about the place you live. Do you have problems with any of the following? Choose all that apply:: None/None on this list    Current Medications[5]        Review Of System: A  review of systems was obtained and is negative , except for as stated   Objective:   PHYSICAL EXAM: BP 124/65   Ht  1.702 m (5' 7)   Wt 136 kg (300 lb)   BMI 46.99 kg/m  Constitutional: Well-developed, well-nourished female in no acute distress Neurological: Alert and oriented to person, place, and time, affect appropriate Psychiatric: Appropriate mood and affect. . Non-disheveled appearance.  Skin: No rashes or lesions, No suspicious lesions, masses, or ulcers.  Neck: Supple without masses. Trachea is midline.Thyroid  is normal size without masses,  Lymphatics: No cervical, axillary, supraclavicular, or inguinal adenopathy noted Respiratory: Clear to auscultation bilaterally. Good air movement with normal work of  breathing. Cardiovascular: Regular rate and rhythm. Extremities grossly normal, nontender with no edema; pulses regular Gastrointestinal: Soft, nontender, nondistended. No masses or hernias appreciated. No hepatosplenomegaly. No fluid wave. No rebound or guarding. Breast Exam: Appear normal and symmetric without palpable masses, skin changes or nipple inversion.  No discharge, rash, or skin retraction. No palpable lymph nodes. No tenderness, masses, or nipple  abnormality Genitourinary:         External Genitalia: Normal female genitalia w/o masses, lesions, rashes    Urethral Meatus: Normal caliber and position    Urethra: Midline, no massesA chaperone was present for the exam.      Bladder: Well-suspended, NT    Vagina: mucosa without lesions or ulcers     Cervix: No lesions, normal size and consistency; no cervical motion tenderness     Uterus: Normal size and contour; smooth, mobile, NT Adnexa/Parametria: No masses; no parametrial nodularity; no tenderness Perineum/Anus: No lesions, no thrombosed hemorids,  A chaperone was present for the exam.   HPI :  Assessment/    Plan:  Lynn Bates is a 53 y.o. female [id number] who presents for annual exam.     1. Annual physical exam Routine wellness issues discussed  --Labs reviewed. .  Lipid panel done 10/23/2023 Total cholesterol was 238, HDL 50, LDL 163--now on a statin . History of HIV Followed by infectious disease Last seen 12/21/2023 HIV RNA done 12/21/2023 was not detected  She is known to be heterozygous factor V Leiden mutation Personal history of DVT    2 Postmenopausal She states her last menstrual period was 5 to 6 years ago Denies any vaginal bleeding or spotting Denies any severe postmenopausal symptoms   History of cervical dysplasia Pap smear updated today GC and chlamydia done 12/21/2023 was negative   3 Denies any family history of breast ovarian uterine or colon cancer Son did have lymphoma  Last mammogram was in May 2024 Request for yearly screening mammogram sent Mammogram done 09/13/2022  Colonoscopy done 06/22/2023 no polyps were found       [1] Past Surgical History: Procedure Laterality Date  . COLPOSCOPY    . GYNECOLOGIC CRYOSURGERY    . IR IVC FILTER PLACEMENT    . PACEMAKER LEAD REPLACEMENT  2009   Procedure: PACEMAKER LEAD PLACEMENT  [2] No Known Allergies [3] Past Medical History: Diagnosis Date  . Abnormal Pap smear of cervix    . Asthma (CMD)   . CHF (congestive heart failure)    (CMD)   . Depression   . Depressive disorder, not elsewhere classified   . Diabetes mellitus    (CMD)   . DVT (deep venous thrombosis)    (CMD)   . Human immunodeficiency virus (HIV) disease    (CMD)   . Hypertension   . Myocardial infarction    (CMD)   . Papanicolaou smear of vagina with low grade squamous intraepithelial lesion (LGSIL)   . Personal history of thrombophlebitis   .  Thyrotoxicosis without mention of goiter or other cause, without mention of thyrotoxic crisis or storm   [4] Family History Problem Relation Name Age of Onset  . Diabetes Maternal Aunt    . Diabetes Daughter    . Cancer Cousin    . Psoriasis Neg Hx    . Eczema Neg Hx    . Colon cancer Neg Hx    . Breast cancer Neg Hx    . Ovarian cancer Neg Hx    [5]  Current Outpatient Medications:  .  albuterol HFA (Ventolin HFA) 90 mcg/actuation inhaler, INL 1 TO 2 PFS PO Q 4 TO 6 H PRN, Disp: , Rfl:  .  amLODIPine (NORVASC) 10 mg tablet, Take 1 tablet (10 mg total) by mouth daily., Disp: 90 tablet, Rfl: 3 .  budesonide-formoteroL (Symbicort) 160-4.5 mcg/actuation inhaler, Inhale., Disp: , Rfl:  .  dorzolamide -timoloL  (COSOPT ) 22.3-6.8 mg/mL ophthalmic solution, Administer 1 drop into both eyes 2 (two) times a day., Disp: , Rfl:  .  Eliquis  5 mg tab, Take 1 tablet (5 mg total) by mouth 2 (two) times a day., Disp: 60 tablet, Rfl: 1 .  emtricitabine -tenofovir  alafen (Descovy ) 200-25 mg tablet, Take 1 tablet by mouth daily., Disp: 30 tablet, Rfl: 5 .  furosemide (LASIX) 20 mg tablet, TAKE 1 TABLET BY MOUTH TWICE DAILY, Disp: 180 tablet, Rfl: 1 .  glipiZIDE (GLUCOTROL XL) 10 mg 24 hr tablet, Take 1 tablet (10 mg total) by mouth daily., Disp: 90 tablet, Rfl: 3 .  glucose blood (Accu-Chek Guide test strips) test strip, Check blood glucose levels at least once daily, Disp: 100 each, Rfl: 3 .  hydrocortisone 2.5 % cream, Apply to the affected areas of the face daily PRN,  Disp: 28 g, Rfl: 4 .  insulin  glargine (LANTUS  SoloStar) 100 unit/mL (3 mL) pen, Inject 10 Units under the skin 2 (two) times a day., Disp: 15 mL, Rfl: 4 .  Lancets misc, Check blood glucose levels at least once daily, Disp: 100 each, Rfl: 5 .  latanoprost (XALATAN) 0.005 % ophthalmic solution, Administer 1 drop into both eyes nightly., Disp: 7.5 mL, Rfl: 3 .  losartan  (COZAAR ) 25 mg tablet, Take 1 tablet (25 mg total) by mouth daily., Disp: 90 tablet, Rfl: 3 .  metoprolol  tartrate (LOPRESSOR ) 50 mg tablet, TAKE 1 TABLET BY MOUTH TWICE DAILY, Disp: 180 tablet, Rfl: 1 .  pantoprazole  (PROTONIX ) 40 mg EC tablet, Take 1 tablet (40 mg total) by mouth every morning before breakfast., Disp: 30 tablet, Rfl: 5 .  pen needle, diabetic 32 gauge x 5/32 ndle, Use to inject medication twice daily, Disp: 200 each, Rfl: 1 .  polyethylene glycol (Miralax) 17 gram powd powder, Take 17 g by mouth daily. Hold for diarrhea, Disp: 510 g, Rfl: 1 .  raltegravir  (ISENTRESS ) 400 mg tablet, Take 1 tablet (400 mg total) by mouth 2 (two) times a day., Disp: 60 tablet, Rfl: 5 .  risperiDONE  (RisperDAL ) 2 mg tab tablet, Take 2 mg by mouth Once Daily., Disp: , Rfl:  .  rosuvastatin (CRESTOR) 10 mg tablet, Take 1 tablet (10 mg total) by mouth daily., Disp: 90 tablet, Rfl: 3 .  tiotropium bromide  (Spiriva  Respimat) 2.5 mcg/actuation mist inhaler, Inhale 2 puffs Once Daily., Disp: , Rfl:

## 2024-03-16 ENCOUNTER — Emergency Department (HOSPITAL_COMMUNITY)

## 2024-03-16 ENCOUNTER — Emergency Department (HOSPITAL_COMMUNITY)
Admission: EM | Admit: 2024-03-16 | Discharge: 2024-03-16 | Disposition: A | Discharge status: Home / Self Care | Attending: Emergency Medicine | Admitting: Emergency Medicine

## 2024-03-16 DIAGNOSIS — I10 Essential (primary) hypertension: Secondary | ICD-10-CM | POA: Diagnosis not present

## 2024-03-16 DIAGNOSIS — Z21 Asymptomatic human immunodeficiency virus [HIV] infection status: Secondary | ICD-10-CM | POA: Insufficient documentation

## 2024-03-16 DIAGNOSIS — R002 Palpitations: Secondary | ICD-10-CM | POA: Insufficient documentation

## 2024-03-16 DIAGNOSIS — R0602 Shortness of breath: Secondary | ICD-10-CM | POA: Insufficient documentation

## 2024-03-16 DIAGNOSIS — Z7901 Long term (current) use of anticoagulants: Secondary | ICD-10-CM | POA: Diagnosis not present

## 2024-03-16 DIAGNOSIS — R079 Chest pain, unspecified: Secondary | ICD-10-CM | POA: Insufficient documentation

## 2024-03-16 DIAGNOSIS — Z79899 Other long term (current) drug therapy: Secondary | ICD-10-CM | POA: Insufficient documentation

## 2024-03-16 DIAGNOSIS — E119 Type 2 diabetes mellitus without complications: Secondary | ICD-10-CM | POA: Diagnosis not present

## 2024-03-16 DIAGNOSIS — E0590 Thyrotoxicosis, unspecified without thyrotoxic crisis or storm: Secondary | ICD-10-CM

## 2024-03-16 LAB — CBC WITH DIFFERENTIAL/PLATELET
Abs Immature Granulocytes: 0.01 K/uL (ref 0.00–0.07)
Basophils Absolute: 0 K/uL (ref 0.0–0.1)
Basophils Relative: 0 %
Eosinophils Absolute: 0.1 K/uL (ref 0.0–0.5)
Eosinophils Relative: 1 %
HCT: 47.9 % — ABNORMAL HIGH (ref 36.0–46.0)
Hemoglobin: 15.5 g/dL — ABNORMAL HIGH (ref 12.0–15.0)
Immature Granulocytes: 0 %
Lymphocytes Relative: 33 %
Lymphs Abs: 2.1 K/uL (ref 0.7–4.0)
MCH: 27.7 pg (ref 26.0–34.0)
MCHC: 32.4 g/dL (ref 30.0–36.0)
MCV: 85.5 fL (ref 80.0–100.0)
Monocytes Absolute: 0.4 K/uL (ref 0.1–1.0)
Monocytes Relative: 6 %
Neutro Abs: 3.9 K/uL (ref 1.7–7.7)
Neutrophils Relative %: 60 %
Platelets: 286 K/uL (ref 150–400)
RBC: 5.6 MIL/uL — ABNORMAL HIGH (ref 3.87–5.11)
RDW: 13.2 % (ref 11.5–15.5)
WBC: 6.5 K/uL (ref 4.0–10.5)
nRBC: 0 % (ref 0.0–0.2)

## 2024-03-16 LAB — COMPREHENSIVE METABOLIC PANEL WITH GFR
ALT: 16 U/L (ref 0–44)
AST: 16 U/L (ref 15–41)
Albumin: 4.4 g/dL (ref 3.5–5.0)
Alkaline Phosphatase: 121 U/L (ref 38–126)
Anion gap: 13 (ref 5–15)
BUN: 13 mg/dL (ref 6–20)
CO2: 24 mmol/L (ref 22–32)
Calcium: 10 mg/dL (ref 8.9–10.3)
Chloride: 99 mmol/L (ref 98–111)
Creatinine, Ser: 0.66 mg/dL (ref 0.44–1.00)
GFR, Estimated: >60 mL/min (ref >60)
Glucose, Bld: 350 mg/dL — ABNORMAL HIGH (ref 70–99)
Potassium: 4.2 mmol/L (ref 3.5–5.1)
Sodium: 137 mmol/L (ref 135–145)
Total Bilirubin: 0.5 mg/dL (ref 0.0–1.2)
Total Protein: 7.6 g/dL (ref 6.5–8.1)

## 2024-03-16 LAB — TROPONIN T, HIGH SENSITIVITY
Troponin T High Sensitivity: 27 ng/L — ABNORMAL HIGH (ref 0–19)
Troponin T High Sensitivity: 31 ng/L — ABNORMAL HIGH (ref 0–19)

## 2024-03-16 LAB — PRO BRAIN NATRIURETIC PEPTIDE: Pro Brain Natriuretic Peptide: 458 pg/mL — ABNORMAL HIGH (ref <300.0)

## 2024-03-16 LAB — TSH: TSH: 0.281 u[IU]/mL — ABNORMAL LOW (ref 0.350–4.500)

## 2024-03-16 LAB — T4, FREE: Free T4: 0.99 ng/dL (ref 0.61–1.12)

## 2024-03-16 MED ORDER — ASPIRIN 81 MG PO CHEW
324.0000 mg | CHEWABLE_TABLET | Freq: Once | ORAL | Status: AC
  Administered 2024-03-16: 324 mg via ORAL
  Filled 2024-03-16: qty 4

## 2024-03-16 MED ORDER — FUROSEMIDE 10 MG/ML IJ SOLN
40.0000 mg | Freq: Once | INTRAMUSCULAR | Status: AC
Start: 2024-03-16 — End: 2024-03-16
  Administered 2024-03-16: 40 mg via INTRAVENOUS
  Filled 2024-03-16: qty 4

## 2024-03-16 NOTE — ED Provider Notes (Signed)
 [facility] AT [facility] Provider Note   CSN: [id number] Arrival date & time: 03/16/24  1325     History Chief Complaint  Patient presents with   Palpitations    HPI: Lynn Bates is a 53 y.o. female with history pertinent for T2DM, elevated BMI, HTN, HIV, depression, cardiomyopathy who presents complaining of palpitations. Patient arrived via POV.  History provided by patient.  No interpreter required during this encounter.  Patient reports that she has intermittent palpitations for the past 2 months which are sometimes accompanied by shortness of breath and chest pain.  Reports that her most recent prior episode of palpitations was this morning while she was at church.  Reports that there was a nurse at church to check her pulse and reportedly it was 180.  Patient reports that she was experiencing chest pain and shortness of breath during this episode.  Reports that she did not want to cause a scene at church, therefore she did not call an ambulance and instead went home to take care of a few things and then came to the emergency department for further evaluation.  Reports that she is currently asymptomatic, however symptoms of palpitations have recurred intermittently without specific precipitating factor, though she does note that they happen more frequently at night when she is trying to go to sleep.  Patient's recorded medical, surgical, social, medication list and allergies were reviewed in the Snapshot window as part of the initial history.   Prior to Admission medications   Medication Sig Start Date End Date Taking? Authorizing Provider  acetaminophen  (TYLENOL ) 500 MG tablet Take 1,000 mg by mouth every 6 (six) hours as needed for mild pain.    [provider]  apixaban  (ELIQUIS ) 5 MG TABS tablet Take 1 tablet (5 mg total) by mouth in the morning and at bedtime. 11/11/21   Gonfa, Taye T, MD  atorvastatin  (LIPITOR) 20 MG tablet Take 1 tablet  (20 mg total) by mouth daily. 11/11/21 11/11/22  Gonfa, Taye T, MD  DESCOVY  200-25 MG tablet Take 1 tablet by mouth daily. 06/24/21   [provider]  dorzolamide -timolol  (COSOPT ) 2-0.5 % ophthalmic solution Place 1 drop into both eyes 2 (two) times daily. 08/31/23   Valdemar Rogue, MD  fluticasone [facility]) 50 MCG/ACT nasal spray two sprays by Both Nostrils route daily. 09/23/20   [provider]  gabapentin  (NEURONTIN ) 300 MG capsule Take 1 capsule (300 mg total) by mouth at bedtime. 11/11/21 01/10/22  Gonfa, Taye T, MD  latanoprost (XALATAN) 0.005 % ophthalmic solution Place 1 drop into both eyes at bedtime. Patient not taking: Reported on 11/09/2021 02/02/17   [provider]  linaclotide LAR[facility]) 145 MCG CAPS capsule Take 145 mcg by mouth daily. Patient not taking: Reported on 11/09/2021 06/05/17   [provider]  losartan -hydrochlorothiazide  (HYZAAR ) 50-12.5 MG tablet Take 1 tablet by mouth daily. 11/11/21 01/10/22  Gonfa, Taye T, MD  meclizine  (ANTIVERT ) 25 MG tablet Take 1 tablet (25 mg total) by mouth 3 (three) times daily as needed for dizziness. 05/15/21   Couture, Cortni S, PA-C  metFORMIN  (GLUCOPHAGE ) 1000 MG tablet Take 1 tablet (1,000 mg total) by mouth 2 (two) times daily with a meal. 11/11/21   Kathrin Mignon DASEN, MD  metoprolol  tartrate (LOPRESSOR ) 50 MG tablet Take 50 mg by mouth in the morning and at bedtime. 10/07/15   [provider]  montelukast  (SINGULAIR ) 10 MG tablet Take by mouth. 03/17/20   [provider]  pantoprazole  (  PROTONIX ) 40 MG tablet Take 40 mg by mouth daily. 04/25/15   [provider]  raltegravir  (ISENTRESS ) 400 MG tablet Take 400 mg by mouth in the morning and at bedtime. 08/03/09   [provider]  risperiDONE  (RISPERDAL ) 2 MG tablet Take 1 tablet by mouth daily.    [provider]  Semaglutide , 1 MG/DOSE, 4 MG/3ML SOPN Inject 1 mg into the skin once a week. 11/11/21   Gonfa, Taye T, MD  Tiotropium  Bromide Monohydrate 2.5 MCG/ACT AERS Inhale 2 puffs into the lungs daily. Patient not taking: Reported on 11/09/2021    [provider]  triamcinolone cream (KENALOG) 0.1 % Apply 1 application topically in the morning and at bedtime. Patient not taking: Reported on 11/09/2021 06/11/19   [provider]  valACYclovir  (VALTREX ) 500 MG tablet Take 1 tablet (500 mg total) by mouth daily. Take 1 tablet by mouth. Can increase to twice a day for 5 days in event of reoccurence 12/26/21   Constant, Winton, MD     Allergies: Patient has no known allergies.   Review of Systems   ROS as per HPI  Physical Exam Updated Vital Signs BP (!) 141/87 (BP Location: Left Arm)   Pulse 90   Temp 98.3 F (36.8 C) (Oral)   Resp 15   Ht 5' 6 (1.676 m)   Wt 131.5 kg   SpO2 97%   BMI 46.81 kg/m  Physical Exam Vitals and nursing note reviewed.  Constitutional:      General: She is not in acute distress.    Appearance: She is well-developed.  HENT:     Head: Normocephalic and atraumatic.  Eyes:     Conjunctiva/sclera: Conjunctivae normal.  Cardiovascular:     Rate and Rhythm: Normal rate and regular rhythm.     Heart sounds: No murmur heard.    Comments: 2+ radial pulses, equal bilaterally Pulmonary:     Effort: Pulmonary effort is normal. No respiratory distress.     Breath sounds: Normal breath sounds.  Abdominal:     Palpations: Abdomen is soft.     Tenderness: There is no abdominal tenderness.  Musculoskeletal:        General: No swelling.     Cervical back: Neck supple.  Skin:    General: Skin is warm and dry.     Capillary Refill: Capillary refill takes less than 2 seconds.  Neurological:     Mental Status: She is alert.  Psychiatric:        Mood and Affect: Mood normal.     ED Course/ Medical Decision Making/ A&P    Procedures Procedures   Medications Ordered in ED Medications  furosemide (LASIX) injection 40 mg (has no administration in time range)  aspirin  chewable tablet 324 mg (324 mg Oral Given 03/16/24 1430)    Medical Decision Making:   Lynn Bates is a 53 y.o. female who presents for intermittent palpitations as per above.  Physical exam is pertinent for no focal abnormalities.   The differential includes but is not limited to hyperthyroidism, hypothyroidism, ACS, arrhythmia, metabolic derangement, dehydration, heart failure exacerbation.  Independent historian: None  External data reviewed: Labs: reviewed prior labs for baseline  Labs: Ordered, Independent interpretation, and Details: CBC without leukocytosis or thrombocytopenia, patient with mild erythrocytosis which is overall similar in comparison to prior.  CMP with hyperglycemia without anion gap elevation.  No emergent electrolyte derangement or emergent LFT abnormality.  BNP mildly elevated at 400, initial troponin elevated at  27.  Radiology: Ordered, Independent interpretation, Details: Chest x-ray without focal airspace opacification, cardiomediastinal fluid gentian, pneumothorax, pleural effusion, bony derangement, and All images reviewed independently.  Agree with radiology report at this time.   DG Chest 2 View Result Date: 03/16/2024 CLINICAL DATA:  Chest pain. EXAM: DG CHEST 2V COMPARISON:  01/09/2023. FINDINGS: The heart is enlarged the mediastinal contour is within normal limits. No consolidation, effusion, or pneumothorax is seen. A dual lead pacemaker device is present over the left chest. No acute osseous abnormality. IMPRESSION: No active cardiopulmonary disease. Electronically Signed   By: Leita Birmingham M.D.   On: 03/16/2024 14:53    EKG/Medicine tests: Ordered and Independent interpretation EKG Interpretation Date/Time:  Sunday March 16 2024 15:08:05 EST Ventricular Rate:  83 PR Interval:  185 QRS Duration:  129 QT Interval:  419 QTC Calculation: 493 R Axis:   92  Text Interpretation: Sinus rhythm Nonspecific intraventricular conduction delay Minimal ST  depression, lateral leads Confirmed by Rogelia Satterfield ([id number]) on 03/16/2024 3:27:52 PM                Interventions: Aspirin, Lasix  See the EMR for full details regarding lab and imaging results.  Aspirin has been given.  The ECG reveals no anatomical ischemia representing STEMI, New-Onset Arrhythmia, or ischemic equivalent, no ectopy.  She has been risk stratified with a HEAR score of 4. Initial troponin is 27; delta troponin is 9 at the time of handoff.  The CXR is unremarkable for focal airspace disease.  The patient is afebrile and denies productive cough.  Therefore, I do not suspect Pneumonia. There is no evidence of Pneumothorax on physical exam or on the CXR. CXR shows no evidence of Esophageal Tear and there is no recent intractable emesis or esophageal instrumentation. There is no peritonitis or free air on CXR worrisome for a Perforated Abdominal Viscous.  Patient does have mildly elevated BNP, patient reports that she takes diuretics at baseline for volume overload including lower extremity edema.  Patient without hypoxia or increased work of breathing on exam, no rales on exam, however given elevated BNP, do feel that dose of Lasix is reasonable as hypervolemia can cause atrial stretch leading to ectopy.  He does not have metabolic derangement to suggest etiology of palpitations.  Patient does report that she had significant tachycardia during her episode previously, however remained without tachycardia while under my care, EKG reveals sinus rhythm.  Discussed that we will perform screening labs today, however she will likely require follow-up with her cardiologist for further evaluation including possible Zio patch.  Patient did have initial mild elevation of troponin, patient is currently chest pain-free, however given multiple risk factors, do feel that patient requires delta troponin.  Additionally patient awaiting TSH.  Plan to follow-up these values to determine  disposition.  Presentation is most consistent with acute complicated illness and Current presentation is complicated by underlying chronic conditions  Discussion of management or test interpretations with external provider(s): None by the time of handoff  Risk Drugs:OTC drugs and Prescription drug management Treatment: Pending at the time of handoff  Disposition: HANDOFF: At the time of signout, the patients delta troponin, TSH had not yet been completed. I transferred care of the patient at the time of signout to Dr. Ula. I informed the incoming care provider of the patient's history, status, and management plan. I addressed all of their concerns and/or questions to the best of my ability. Please refer to the incoming care provider's note for  details regarding the remainder of the patient's ED course and disposition.  MDM generated using voice dictation software and may contain dictation errors.  Please contact me for any clarification or with any questions.   Clinical Impression:  1. Palpitations      Data Unavailable   Final Clinical Impression(s) / ED Diagnoses Final diagnoses:  Palpitations    Rx / DC Orders ED Discharge Orders     None        Rogelia Jerilynn RAMAN, MD 03/16/24 1627

## 2024-03-16 NOTE — Discharge Instructions (Signed)
 Your workup today was reassuring.  Our cardiologist here recommended that you follow-up with your cardiologist at [facility].  Please call tomorrow to schedule follow-up appointment.  Your thyroid  function testing were a little abnormal today.  As discussed, please call your primary care doctor to have this rechecked as an outpatient.  The T3 level is pending and will not be back today so your primary care doctor can follow-up regarding this.  Please return to the emergency department for worsening symptoms.

## 2024-03-16 NOTE — ED Provider Notes (Signed)
 I received the patient in signout presenting with palpitations.  The remainder of her labs were pending including her TSH.  The patient's TSH was actually a little low.  T4 is test as well as T3.  T4 is normal.  T3 is pending when I be back this evening.  I did call and discussed patient's case with cardiology as she does have a history of cardiomyopathy and does have a pacemaker.  They recommended interrogating the pacemaker which was done here.  There have been no recent ventricular arrhythmias.  The patient did have 1 episode of nonsustained V. tach a few months ago.  The patient second troponin is downtrending.  Given these 2 reassuring findings the patient will be discharged with cardiology follow-up with return precautions. Physical Exam  BP (!) 116/99 (BP Location: Right Wrist)   Pulse 90   Temp 98.3 F (36.8 C) (Oral)   Resp 18   Ht 5' 6 (1.676 m)   Wt 131.5 kg   SpO2 96%   BMI 46.81 kg/m   Physical Exam General: No acute distress  Procedures  Procedures  ED Course / MDM    Medical Decision Making Amount and/or Complexity of Data Reviewed Labs: ordered. Radiology: ordered.  Risk OTC drugs. Prescription drug management.          Ula Prentice SAUNDERS, MD 03/16/24 [phone]

## 2024-03-16 NOTE — ED Triage Notes (Signed)
 Patient reports heart feels like its racing, started 2 days ago Has pacemaker Says she was at church today and nurse there took pulse and says pulse was 180 Denies shobr Denies dizziness  Heart rate in triage is 34 Patient says she feels worse now than she did in church

## 2024-03-16 NOTE — Congregational Nurse Program (Signed)
 Encounter at [employer].  Adeana had been feeling bad last couple of days, reports on metoaprolol and amlodipine.  Did not take meds this morning.  Has has financial problems getting meds and getting to dr.  Danie to be seen today.  Wanted to go to [facility] ER.  [employer] called by Juliene Aures to take home and to ER.  Will followup to find resources for getting medications.  Patient placed in [place] and promised to go to ER.  Merlynn Pines, RN, Congregational Nurse, [phone].

## 2024-03-18 LAB — T3, FREE: T3, Free: 3.1 pg/mL (ref 2.0–4.4)

## 2024-08-29 ENCOUNTER — Encounter (INDEPENDENT_AMBULATORY_CARE_PROVIDER_SITE_OTHER): Admitting: Ophthalmology
